# Patient Record
Sex: Female | Born: 1985 | Race: Black or African American | Hispanic: No | Marital: Single | Smoking: Current every day smoker
Health system: Southern US, Community
[De-identification: ages and names within clinical notes are randomized; demographics above are authoritative.]

## PROBLEM LIST (undated history)

## (undated) DIAGNOSIS — J45909 Unspecified asthma, uncomplicated: Secondary | ICD-10-CM

## (undated) DIAGNOSIS — G4730 Sleep apnea, unspecified: Secondary | ICD-10-CM

## (undated) HISTORY — DX: Sleep apnea, unspecified: G47.30

---

## 2009-04-20 ENCOUNTER — Observation Stay: Payer: Self-pay | Admitting: Obstetrics & Gynecology

## 2009-04-24 ENCOUNTER — Inpatient Hospital Stay: Payer: Self-pay | Admitting: Unknown Physician Specialty

## 2013-03-29 ENCOUNTER — Emergency Department (HOSPITAL_COMMUNITY)
Admission: EM | Admit: 2013-03-29 | Discharge: 2013-03-29 | Disposition: A | Discharge status: Home / Self Care | Payer: Medicaid Other | Attending: Emergency Medicine | Admitting: Emergency Medicine

## 2013-03-29 ENCOUNTER — Encounter (HOSPITAL_COMMUNITY): Payer: Self-pay | Admitting: Emergency Medicine

## 2013-03-29 DIAGNOSIS — J45909 Unspecified asthma, uncomplicated: Secondary | ICD-10-CM | POA: Insufficient documentation

## 2013-03-29 DIAGNOSIS — A084 Viral intestinal infection, unspecified: Secondary | ICD-10-CM

## 2013-03-29 DIAGNOSIS — A088 Other specified intestinal infections: Secondary | ICD-10-CM | POA: Insufficient documentation

## 2013-03-29 DIAGNOSIS — Z3202 Encounter for pregnancy test, result negative: Secondary | ICD-10-CM | POA: Insufficient documentation

## 2013-03-29 HISTORY — DX: Unspecified asthma, uncomplicated: J45.909

## 2013-03-29 LAB — URINE MICROSCOPIC-ADD ON

## 2013-03-29 LAB — CBC WITH DIFFERENTIAL/PLATELET
Basophils Relative: 0 % (ref 0–1)
Eosinophils Absolute: 0.1 10*3/uL (ref 0.0–0.7)
HCT: 44.3 % (ref 36.0–46.0)
Hemoglobin: 15.4 g/dL — ABNORMAL HIGH (ref 12.0–15.0)
MCHC: 34.8 g/dL (ref 30.0–36.0)
Monocytes Relative: 8 % (ref 3–12)
Neutro Abs: 5.8 10*3/uL (ref 1.7–7.7)
Neutrophils Relative %: 79 % — ABNORMAL HIGH (ref 43–77)
Platelets: 127 10*3/uL — ABNORMAL LOW (ref 150–400)
RBC: 5.16 MIL/uL — ABNORMAL HIGH (ref 3.87–5.11)

## 2013-03-29 LAB — COMPREHENSIVE METABOLIC PANEL
ALT: 17 U/L (ref 0–35)
AST: 18 U/L (ref 0–37)
Albumin: 4.8 g/dL (ref 3.5–5.2)
Alkaline Phosphatase: 91 U/L (ref 39–117)
BUN: 13 mg/dL (ref 6–23)
CO2: 20 mEq/L (ref 19–32)
Chloride: 104 mEq/L (ref 96–112)
Potassium: 4.2 mEq/L (ref 3.5–5.1)
Sodium: 139 mEq/L (ref 135–145)
Total Bilirubin: 0.5 mg/dL (ref 0.3–1.2)
Total Protein: 8.6 g/dL — ABNORMAL HIGH (ref 6.0–8.3)

## 2013-03-29 LAB — URINALYSIS, ROUTINE W REFLEX MICROSCOPIC
Glucose, UA: NEGATIVE mg/dL
Hgb urine dipstick: NEGATIVE
Ketones, ur: 15 mg/dL — AB
Nitrite: NEGATIVE
Specific Gravity, Urine: 1.031 — ABNORMAL HIGH (ref 1.005–1.030)
pH: 5.5 (ref 5.0–8.0)

## 2013-03-29 LAB — LIPASE, BLOOD: Lipase: 18 U/L (ref 11–59)

## 2013-03-29 MED ORDER — DIPHENOXYLATE-ATROPINE 2.5-0.025 MG PO TABS: 2.0000 | ORAL_TABLET | Freq: Four times a day (QID) | ORAL | Status: DC | PRN

## 2013-03-29 MED ORDER — SODIUM CHLORIDE 0.9 % IV BOLUS (SEPSIS)
1000.0000 mL | Freq: Once | INTRAVENOUS | Status: AC
  Administered 2013-03-29: 1000 mL via INTRAVENOUS

## 2013-03-29 MED ORDER — ONDANSETRON 8 MG PO TBDP: 8.0000 mg | ORAL_TABLET | Freq: Three times a day (TID) | ORAL | Status: DC | PRN

## 2013-03-29 MED ORDER — ONDANSETRON HCL 4 MG/2ML IJ SOLN
4.0000 mg | Freq: Once | INTRAMUSCULAR | Status: AC
  Administered 2013-03-29: 4 mg via INTRAVENOUS
  Filled 2013-03-29: qty 2

## 2013-03-29 MED ORDER — SODIUM CHLORIDE 0.9 % IV SOLN: INTRAVENOUS | Status: DC

## 2013-03-29 NOTE — ED Notes (Signed)
Pt c/o abd pain into back with N/V/D x 2 days

## 2013-03-29 NOTE — ED Notes (Signed)
Unable to obtain blood, lab called

## 2013-03-29 NOTE — ED Provider Notes (Signed)
CSN: [id number]     Arrival date & time 03/29/13  1833 History   First MD Initiated Contact with Patient 03/29/13 2148     Chief Complaint  Patient presents with  . Abdominal Pain  . Emesis  . Diarrhea   (Consider location/radiation/quality/duration/timing/severity/associated sxs/prior Treatment) Patient is a 27 y.o. female presenting with abdominal pain, vomiting, and diarrhea. The history is provided by the patient.  Abdominal Pain Associated symptoms: diarrhea and vomiting   Emesis Associated symptoms: abdominal pain and diarrhea   Diarrhea Associated symptoms: abdominal pain and vomiting    Patient here complaining of 24-hour history of nonbilious vomiting with associated watery diarrhea. Denies any fever or chills. Notes diffuse abdominal pain. No dysuria or hematuria. Symptoms have been persistent. She denies any sick exposures. Denies any headache or neck pain or photophobia. Nothing makes her symptoms better or worse. No treatment used prior to arrival Past Medical History  Diagnosis Date  . Asthma    History reviewed. No pertinent past surgical history. History reviewed. No pertinent family history. History  Substance Use Topics  . Smoking status: Never Smoker   . Smokeless tobacco: Not on file  . Alcohol Use: No   OB History   Grav Para Term Preterm Abortions TAB SAB Ect Mult Living                 Review of Systems  Gastrointestinal: Positive for vomiting, abdominal pain and diarrhea.  All other systems reviewed and are negative.    Allergies  Rocephin  Home Medications  No current outpatient prescriptions on file. BP 103/57  Pulse 81  Temp(Src) 98.1 F (36.7 C) (Oral)  Resp 18  SpO2 100% Physical Exam  Nursing note and vitals reviewed. Constitutional: She is oriented to person, place, and time. She appears well-developed and well-nourished.  Non-toxic appearance. No distress.  HENT:  Head: Normocephalic and atraumatic.  Eyes: Conjunctivae, EOM  and lids are normal. Pupils are equal, round, and reactive to light.  Neck: Normal range of motion. Neck supple. No tracheal deviation present. No mass present.  Cardiovascular: Normal rate, regular rhythm and normal heart sounds.  Exam reveals no gallop.   No murmur heard. Pulmonary/Chest: Effort normal and breath sounds normal. No stridor. No respiratory distress. She has no decreased breath sounds. She has no wheezes. She has no rhonchi. She has no rales.  Abdominal: Soft. Normal appearance and bowel sounds are normal. She exhibits no distension. There is no tenderness. There is no rebound and no CVA tenderness.  Musculoskeletal: Normal range of motion. She exhibits no edema and no tenderness.  Neurological: She is alert and oriented to person, place, and time. She has normal strength. No cranial nerve deficit or sensory deficit. GCS eye subscore is 4. GCS verbal subscore is 5. GCS motor subscore is 6.  Skin: Skin is warm and dry. No abrasion and no rash noted.  Psychiatric: She has a normal mood and affect. Her speech is normal and behavior is normal.    ED Course  Procedures (including critical care time) Labs Review Labs Reviewed  CBC WITH DIFFERENTIAL - Abnormal; Notable for the following:    RBC 5.16 (*)    Hemoglobin 15.4 (*)    Platelets 127 (*)    Neutrophils Relative % 79 (*)    All other components within normal limits  COMPREHENSIVE METABOLIC PANEL - Abnormal; Notable for the following:    Total Protein 8.6 (*)    GFR calc non Af Amer 87 (*)  All other components within normal limits  URINALYSIS, ROUTINE W REFLEX MICROSCOPIC - Abnormal; Notable for the following:    APPearance HAZY (*)    Specific Gravity, Urine 1.031 (*)    Bilirubin Urine SMALL (*)    Ketones, ur 15 (*)    Protein, ur 30 (*)    Leukocytes, UA SMALL (*)    All other components within normal limits  URINE MICROSCOPIC-ADD ON - Abnormal; Notable for the following:    Squamous Epithelial / LPF FEW (*)     Bacteria, UA FEW (*)    All other components within normal limits  URINE CULTURE  LIPASE, BLOOD  POCT PREGNANCY, URINE   Imaging Review No results found.  EKG Interpretation   None       MDM  No diagnosis found. Patient to be treated for likely gastroenteritis. Was given IV fluids here. Stable for discharge    Toy Baker, MD 03/29/13 [phone]

## 2013-03-31 LAB — URINE CULTURE: Colony Count: 70000

## 2013-06-19 ENCOUNTER — Emergency Department (HOSPITAL_COMMUNITY)
Admission: EM | Admit: 2013-06-19 | Discharge: 2013-06-19 | Disposition: A | Discharge status: Home / Self Care | Payer: Medicaid Other | Attending: Emergency Medicine | Admitting: Emergency Medicine

## 2013-06-19 ENCOUNTER — Encounter (HOSPITAL_COMMUNITY): Payer: Self-pay | Admitting: Emergency Medicine

## 2013-06-19 DIAGNOSIS — R091 Pleurisy: Secondary | ICD-10-CM | POA: Insufficient documentation

## 2013-06-19 DIAGNOSIS — A499 Bacterial infection, unspecified: Secondary | ICD-10-CM | POA: Insufficient documentation

## 2013-06-19 DIAGNOSIS — J45909 Unspecified asthma, uncomplicated: Secondary | ICD-10-CM | POA: Insufficient documentation

## 2013-06-19 DIAGNOSIS — N760 Acute vaginitis: Secondary | ICD-10-CM | POA: Insufficient documentation

## 2013-06-19 DIAGNOSIS — B9689 Other specified bacterial agents as the cause of diseases classified elsewhere: Secondary | ICD-10-CM | POA: Insufficient documentation

## 2013-06-19 DIAGNOSIS — Z3202 Encounter for pregnancy test, result negative: Secondary | ICD-10-CM | POA: Insufficient documentation

## 2013-06-19 LAB — CBC WITH DIFFERENTIAL/PLATELET
BASOS ABS: 0 10*3/uL (ref 0.0–0.1)
Basophils Relative: 0 % (ref 0–1)
Eosinophils Absolute: 0.1 10*3/uL (ref 0.0–0.7)
Eosinophils Relative: 2 % (ref 0–5)
HEMATOCRIT: 41.1 % (ref 36.0–46.0)
Hemoglobin: 14.2 g/dL (ref 12.0–15.0)
LYMPHS PCT: 49 % — AB (ref 12–46)
Lymphs Abs: 2.5 10*3/uL (ref 0.7–4.0)
MCH: 29.5 pg (ref 26.0–34.0)
MCHC: 34.5 g/dL (ref 30.0–36.0)
MCV: 85.4 fL (ref 78.0–100.0)
MONO ABS: 0.3 10*3/uL (ref 0.1–1.0)
Monocytes Relative: 6 % (ref 3–12)
Neutro Abs: 2.3 10*3/uL (ref 1.7–7.7)
Neutrophils Relative %: 44 % (ref 43–77)
PLATELETS: 134 10*3/uL — AB (ref 150–400)
RBC: 4.81 MIL/uL (ref 3.87–5.11)
RDW: 13.4 % (ref 11.5–15.5)
WBC: 5.2 10*3/uL (ref 4.0–10.5)

## 2013-06-19 LAB — URINALYSIS, ROUTINE W REFLEX MICROSCOPIC
Bilirubin Urine: NEGATIVE
Glucose, UA: NEGATIVE mg/dL
Hgb urine dipstick: NEGATIVE
Ketones, ur: NEGATIVE mg/dL
Leukocytes, UA: NEGATIVE
NITRITE: NEGATIVE
PROTEIN: NEGATIVE mg/dL
Specific Gravity, Urine: 1.022 (ref 1.005–1.030)
UROBILINOGEN UA: 1 mg/dL (ref 0.0–1.0)
pH: 6 (ref 5.0–8.0)

## 2013-06-19 LAB — COMPREHENSIVE METABOLIC PANEL
ALT: 17 U/L (ref 0–35)
AST: 17 U/L (ref 0–37)
Albumin: 4.2 g/dL (ref 3.5–5.2)
Alkaline Phosphatase: 101 U/L (ref 39–117)
BUN: 15 mg/dL (ref 6–23)
CO2: 24 mEq/L (ref 19–32)
Calcium: 9.6 mg/dL (ref 8.4–10.5)
Chloride: 103 mEq/L (ref 96–112)
Creatinine, Ser: 0.92 mg/dL (ref 0.50–1.10)
GFR calc non Af Amer: 86 mL/min — ABNORMAL LOW (ref >90)
Glucose, Bld: 69 mg/dL — ABNORMAL LOW (ref 70–99)
POTASSIUM: 3.7 meq/L (ref 3.7–5.3)
Sodium: 142 mEq/L (ref 137–147)
TOTAL PROTEIN: 7.7 g/dL (ref 6.0–8.3)
Total Bilirubin: 0.5 mg/dL (ref 0.3–1.2)

## 2013-06-19 LAB — WET PREP, GENITAL
Trich, Wet Prep: NONE SEEN
WBC, Wet Prep HPF POC: NONE SEEN
Yeast Wet Prep HPF POC: NONE SEEN

## 2013-06-19 LAB — I-STAT TROPONIN, ED: TROPONIN I, POC: 0 ng/mL (ref 0.00–0.08)

## 2013-06-19 LAB — LIPASE, BLOOD: Lipase: 16 U/L (ref 11–59)

## 2013-06-19 LAB — POC URINE PREG, ED: PREG TEST UR: NEGATIVE

## 2013-06-19 MED ORDER — METRONIDAZOLE 500 MG PO TABS: 500.0000 mg | ORAL_TABLET | Freq: Two times a day (BID) | ORAL | Status: DC

## 2013-06-19 MED ORDER — IBUPROFEN 600 MG PO TABS
600.0000 mg | ORAL_TABLET | Freq: Four times a day (QID) | ORAL | Status: DC | PRN
Start: 2013-06-19 — End: 2014-05-17

## 2013-06-19 NOTE — ED Notes (Signed)
Pt states she has been having sharp left sided cp at night and pain at her c-section site which was in 2011. Both areas have been hurting off and on for the past week. C/o some nausea but no vomiting or diarrhea. Denies any SOB.

## 2013-06-19 NOTE — Discharge Instructions (Signed)
We saw you in the ER for the chest pain and abdominal pain. All the results in the ER are normal, labs and imaging. We are not sure what is causing your symptoms. The workup in the ER is not complete, and is limited to screening for life threatening and emergent conditions only, so please see a primary care doctor for further evaluation.    Bacterial Vaginosis Bacterial vaginosis is a vaginal infection that occurs when the normal balance of bacteria in the vagina is disrupted. It results from an overgrowth of certain bacteria. This is the most common vaginal infection in women of childbearing age. Treatment is important to prevent complications, especially in pregnant women, as it can cause a premature delivery. CAUSES  Bacterial vaginosis is caused by an increase in harmful bacteria that are normally present in smaller amounts in the vagina. Several different kinds of bacteria can cause bacterial vaginosis. However, the reason that the condition develops is not fully understood. RISK FACTORS Certain activities or behaviors can put you at an increased risk of developing bacterial vaginosis, including:  Having a new sex partner or multiple sex partners.  Douching.  Using an intrauterine device (IUD) for contraception. Women do not get bacterial vaginosis from toilet seats, bedding, swimming pools, or contact with objects around them. SIGNS AND SYMPTOMS  Some women with bacterial vaginosis have no signs or symptoms. Common symptoms include:  Grey vaginal discharge.  A fishlike odor with discharge, especially after sexual intercourse.  Itching or burning of the vagina and vulva.  Burning or pain with urination. DIAGNOSIS  Your health care provider will take a medical history and examine the vagina for signs of bacterial vaginosis. A sample of vaginal fluid may be taken. Your health care provider will look at this sample under a microscope to check for bacteria and abnormal cells. A  vaginal pH test may also be done.  TREATMENT  Bacterial vaginosis may be treated with antibiotic medicines. These may be given in the form of a pill or a vaginal cream. A second round of antibiotics may be prescribed if the condition comes back after treatment.  HOME CARE INSTRUCTIONS   Only take over-the-counter or prescription medicines as directed by your health care provider.  If antibiotic medicine was prescribed, take it as directed. Make sure you finish it even if you start to feel better.  Do not have sex until treatment is completed.  Tell all sexual partners that you have a vaginal infection. They should see their health care provider and be treated if they have problems, such as a mild rash or itching.  Practice safe sex by using condoms and only having one sex partner. SEEK MEDICAL CARE IF:   Your symptoms are not improving after 3 days of treatment.  You have increased discharge or pain.  You have a fever. MAKE SURE YOU:   Understand these instructions.  Will watch your condition.  Will get help right away if you are not doing well or get worse. FOR MORE INFORMATION  Centers for Disease Control and Prevention, Division of STD Prevention: [employer]s.[employer].za American Sexual Health Association (ASHA): www.ashastd.org  Document Released: 03/30/2005 Document Revised: 01/18/2013 Document Reviewed: 11/09/2012 [facility] Patient Information 2014 [place], [place].  Pleurisy Pleurisy is an inflammation and swelling of the lining of the lungs (pleura). Because of this inflammation, it hurts to breathe. It can be aggravated by coughing, laughing, or deep breathing. Pleurisy is often caused by an underlying infection or disease.  HOME CARE INSTRUCTIONS  Monitor your pleurisy for any changes. The following actions may help to alleviate any discomfort you are experiencing:  Medicine may help with pain. Only take over-the-counter or prescription medicines for pain, discomfort, or fever  as directed by your health care provider.  Only take antibiotic medicine as directed. Make sure to finish it even if you start to feel better. SEEK MEDICAL CARE IF:   Your pain is not controlled with medicine or is increasing.  You have an increase in pus-like (purulent) secretions brought up with coughing. SEEK IMMEDIATE MEDICAL CARE IF:   You have blue or dark lips, fingernails, or toenails.  You are coughing up blood.  You have increased difficulty breathing.  You have continuing pain unrelieved by medicine or pain lasting more than 1 week.  You have pain that radiates into your neck, arms, or jaw.  You develop increased shortness of breath or wheezing.  You develop a fever, rash, vomiting, fainting, or other serious symptoms. MAKE SURE YOU:  Understand these instructions.   Will watch your condition.   Will get help right away if you are not doing well or get worse.  Document Released: 03/30/2005 Document Revised: 11/30/2012 Document Reviewed: 09/11/2012 [facility] Patient Information 2014 [place], [place].

## 2013-06-20 LAB — GC/CHLAMYDIA PROBE AMP
CT PROBE, AMP APTIMA: NEGATIVE
GC PROBE AMP APTIMA: NEGATIVE

## 2013-06-20 NOTE — ED Provider Notes (Signed)
CSN: [id number]     Arrival date & time 06/19/13  1557 History   First MD Initiated Contact with Patient 06/19/13 1803     Chief Complaint  Patient presents with  . Abdominal Pain  . Chest Pain     (Consider location/radiation/quality/duration/timing/severity/associated sxs/prior Treatment) HPI Comments: Pt comes in with cc of left sided chest pain, intermittent x 2 days and lower quadrant abd pain x 2 weeks. Both pains are intermittent, with no specific aggravating or relieving factors. Pt has no n/v/f/c/cough/dib. Chest pain is described as pressure like. No vaginal discharge, bleeding, uti like sx.  Patient is a 28 y.o. female presenting with abdominal pain and chest pain. The history is provided by the patient.  Abdominal Pain Associated symptoms: chest pain   Associated symptoms: no dysuria, no nausea, no shortness of breath and no vomiting   Chest Pain Associated symptoms: abdominal pain   Associated symptoms: no headache, no nausea, no shortness of breath and not vomiting     Past Medical History  Diagnosis Date  . Asthma    Past Surgical History  Procedure Laterality Date  . Cesarean section     No family history on file. History  Substance Use Topics  . Smoking status: Never Smoker   . Smokeless tobacco: Not on file  . Alcohol Use: No   OB History   Grav Para Term Preterm Abortions TAB SAB Ect Mult Living                 Review of Systems  Constitutional: Negative for activity change.  Respiratory: Negative for shortness of breath.   Cardiovascular: Positive for chest pain.  Gastrointestinal: Positive for abdominal pain. Negative for nausea and vomiting.  Genitourinary: Negative for dysuria.  Musculoskeletal: Negative for neck pain.  Neurological: Negative for headaches.  All other systems reviewed and are negative.      Allergies  Rocephin  Home Medications   Current Outpatient Rx  Name  Route  Sig  Dispense  Refill  . ibuprofen (ADVIL,MOTRIN)  600 MG tablet   Oral   Take 1 tablet (600 mg total) by mouth every 6 (six) hours as needed.   30 tablet   0   . metroNIDAZOLE (FLAGYL) 500 MG tablet   Oral   Take 1 tablet (500 mg total) by mouth 2 (two) times daily.   14 tablet   0    BP 113/71  Pulse 74  Temp(Src) 98.7 F (37.1 C) (Oral)  Resp 18  Wt 145 lb (65.772 kg)  SpO2 100% Physical Exam  Nursing note and vitals reviewed. Constitutional: She is oriented to person, place, and time. She appears well-developed.  HENT:  Head: Normocephalic and atraumatic.  Eyes: Conjunctivae and EOM are normal. Pupils are equal, round, and reactive to light.  Neck: Normal range of motion. Neck supple.  Cardiovascular: Normal rate, regular rhythm, normal heart sounds and intact distal pulses.   No murmur heard. Pulmonary/Chest: Effort normal. No respiratory distress. She has no wheezes.  Abdominal: Soft. Bowel sounds are normal. She exhibits no distension. There is tenderness. There is no rebound and no guarding.  Lower quadrant bilaterally, with no flank tenderness.  Genitourinary: Vagina normal and uterus normal.  External exam - normal, no lesions Speculum exam: Pt has some white discharge, no blood Bimanual exam: Patient has no CMT, no adnexal tenderness or fullness and cervical os is closed  Neurological: She is alert and oriented to person, place, and time.  Skin: Skin is  warm and dry.    ED Course  Procedures (including critical care time) Labs Review Labs Reviewed  WET PREP, GENITAL - Abnormal; Notable for the following:    Clue Cells Wet Prep HPF POC FEW (*)    All other components within normal limits  COMPREHENSIVE METABOLIC PANEL - Abnormal; Notable for the following:    Glucose, Bld 69 (*)    GFR calc non Af Amer 86 (*)    All other components within normal limits  CBC WITH DIFFERENTIAL - Abnormal; Notable for the following:    Platelets 134 (*)    Lymphocytes Relative 49 (*)    All other components within normal  limits  URINALYSIS, ROUTINE W REFLEX MICROSCOPIC - Abnormal; Notable for the following:    APPearance CLOUDY (*)    All other components within normal limits  GC/CHLAMYDIA PROBE AMP  LIPASE, BLOOD  I-STAT TROPOININ, ED  POC URINE PREG, ED   Imaging Review No results found.   EKG Interpretation   Date/Time:  Monday June 19 2013 16:12:23 EDT Ventricular Rate:  74 PR Interval:  134 QRS Duration: 88 QT Interval:  378 QTC Calculation: 419 R Axis:   62 Text Interpretation:  Normal sinus rhythm Normal ECG Confirmed by  Quintavious Rinck, MD, Janey Genta ([id number]) on 06/19/2013 6:36:41 PM      MDM   Final diagnoses:  Bacterial vaginosis  Pleurisy    PT comes in with intermittent chest pain and abd pain. Chest pain is pleuritic on occasion, no new cough, and she has WELLS score of 0 and is PERC neg. Pt's EKG is fine,. Labs are normal, no further concerns at this time from the ED perspective.  Abd pain - with non peritoneal abd exam and normal pelvic exam. Will treat the BV. Stable for discharge. PCP f/u requested, and return precautions discussed.    Derwood Kaplan, MD 06/20/13 [phone]

## 2013-07-17 ENCOUNTER — Emergency Department (INDEPENDENT_AMBULATORY_CARE_PROVIDER_SITE_OTHER)
Admission: EM | Admit: 2013-07-17 | Discharge: 2013-07-17 | Disposition: A | Discharge status: Home / Self Care | Payer: Medicaid Other | Source: Home / Self Care | Attending: Emergency Medicine | Admitting: Emergency Medicine

## 2013-07-17 ENCOUNTER — Encounter (HOSPITAL_COMMUNITY): Payer: Self-pay | Admitting: Emergency Medicine

## 2013-07-17 DIAGNOSIS — R1110 Vomiting, unspecified: Secondary | ICD-10-CM

## 2013-07-17 DIAGNOSIS — R109 Unspecified abdominal pain: Secondary | ICD-10-CM

## 2013-07-17 LAB — POCT URINALYSIS DIP (DEVICE)
Bilirubin Urine: NEGATIVE
Glucose, UA: NEGATIVE mg/dL
Hgb urine dipstick: NEGATIVE
Ketones, ur: NEGATIVE mg/dL
LEUKOCYTES UA: NEGATIVE
NITRITE: NEGATIVE
PROTEIN: NEGATIVE mg/dL
Specific Gravity, Urine: 1.015 (ref 1.005–1.030)
UROBILINOGEN UA: 0.2 mg/dL (ref 0.0–1.0)
pH: 6.5 (ref 5.0–8.0)

## 2013-07-17 LAB — POCT PREGNANCY, URINE: PREG TEST UR: NEGATIVE

## 2013-07-17 NOTE — ED Provider Notes (Signed)
Medical screening examination/treatment/procedure(s) were performed by non-physician practitioner and as supervising physician I was immediately available for consultation/collaboration.  Leslee Homeavid Hosey Burmester, M.D.  Reuben Likesavid C Berklie Dethlefs, MD 07/17/13 [phone]

## 2013-07-17 NOTE — ED Provider Notes (Signed)
CSN: [id number]     Arrival date & time 07/17/13  4098 History   First MD Initiated Contact with Patient 07/17/13 1020     Chief Complaint  Patient presents with  . Abdominal Pain   (Consider location/radiation/quality/duration/timing/severity/associated sxs/prior Treatment) HPI Comments: 28 year old female presents complaining of abdominal pain and vomiting for 2 days. She has had the abdominal pain across her lower abdomen for one month, and she just started to have the upper abdominal pain and vomiting after eating for the past 2 days. She has not been able to hold down any food for the past 2 days. The pain radiates through to her back. The pain is not worsened despite being. She denies fever, chills, diarrhea, constipation. Last bowel movement was yesterday and was normal.  Patient is a 28 y.o. female presenting with abdominal pain.  Abdominal Pain Associated symptoms: nausea and vomiting   Associated symptoms: no constipation and no diarrhea     Past Medical History  Diagnosis Date  . Asthma    Past Surgical History  Procedure Laterality Date  . Cesarean section     History reviewed. No pertinent family history. History  Substance Use Topics  . Smoking status: Never Smoker   . Smokeless tobacco: Not on file  . Alcohol Use: No   OB History   Grav Para Term Preterm Abortions TAB SAB Ect Mult Living                 Review of Systems  Gastrointestinal: Positive for nausea, vomiting and abdominal pain. Negative for diarrhea, constipation and blood in stool.  Musculoskeletal: Positive for back pain.  All other systems reviewed and are negative.    Allergies  Rocephin  Home Medications   Current Outpatient Rx  Name  Route  Sig  Dispense  Refill  . ibuprofen (ADVIL,MOTRIN) 600 MG tablet   Oral   Take 1 tablet (600 mg total) by mouth every 6 (six) hours as needed.   30 tablet   0   . metroNIDAZOLE (FLAGYL) 500 MG tablet   Oral   Take 1 tablet (500 mg total) by  mouth 2 (two) times daily.   14 tablet   0    BP 117/87  Pulse 72  Temp(Src) 98.2 F (36.8 C) (Oral)  Resp 16  SpO2 100% Physical Exam  Nursing note and vitals reviewed. Constitutional: She is oriented to person, place, and time. Vital signs are normal. She appears well-developed and well-nourished. No distress.  HENT:  Head: Normocephalic and atraumatic.  Cardiovascular: Normal rate and regular rhythm.  Exam reveals no gallop and no friction rub.   No murmur heard. Pulmonary/Chest: Effort normal and breath sounds normal. No respiratory distress. She has no wheezes. She has no rales.  Abdominal: Normal appearance and bowel sounds are normal. She exhibits no mass. There is tenderness in the right upper quadrant, right lower quadrant, epigastric area, suprapubic area and left lower quadrant. There is positive Murphy's sign. There is no rigidity, no rebound, no guarding and no tenderness at McBurney's point.  Increased right upper quadrant and epigastric abdominal pain with coughing  Neurological: She is alert and oriented to person, place, and time. She has normal strength. Coordination normal.  Skin: Skin is warm and dry. No rash noted. She is not diaphoretic.  Psychiatric: She has a normal mood and affect. Judgment normal.    ED Course  Procedures (including critical care time) Labs Review Labs Reviewed  POCT URINALYSIS DIP (DEVICE)  POCT  PREGNANCY, URINE   Imaging Review No results found.   MDM   1. Abdominal pain   2. Vomiting    I explained to patient that we would need to send some labs and do a pelvic exam to further investigate cause of her abdominal pain. She says that she cannot do that and she is to go to work. I have explained to her the risks and possible seriousness of her condition, she says she is okay with that and she will come back. She is leaving AMA, AMA form signed    Graylon GoodZachary H Tanor Glaspy, PA-C 07/17/13 1037

## 2013-07-17 NOTE — ED Notes (Signed)
C/o  Abdominal pain with vomiting x 2 days.  Lower back pain with urinary frequency.  No relief of pain with ibuprofen.   Denies any other symptoms.

## 2014-04-02 DIAGNOSIS — J45909 Unspecified asthma, uncomplicated: Secondary | ICD-10-CM | POA: Diagnosis not present

## 2014-04-02 DIAGNOSIS — N730 Acute parametritis and pelvic cellulitis: Secondary | ICD-10-CM | POA: Insufficient documentation

## 2014-04-02 DIAGNOSIS — Z3202 Encounter for pregnancy test, result negative: Secondary | ICD-10-CM | POA: Diagnosis not present

## 2014-04-02 DIAGNOSIS — R109 Unspecified abdominal pain: Secondary | ICD-10-CM | POA: Diagnosis present

## 2014-04-02 DIAGNOSIS — A5901 Trichomonal vulvovaginitis: Secondary | ICD-10-CM | POA: Insufficient documentation

## 2014-04-02 DIAGNOSIS — Z792 Long term (current) use of antibiotics: Secondary | ICD-10-CM | POA: Diagnosis not present

## 2014-04-03 ENCOUNTER — Encounter (HOSPITAL_COMMUNITY): Payer: Self-pay | Admitting: *Deleted

## 2014-04-03 ENCOUNTER — Emergency Department (HOSPITAL_COMMUNITY)
Admission: EM | Admit: 2014-04-03 | Discharge: 2014-04-03 | Disposition: A | Discharge status: Home / Self Care | Payer: Medicaid Other | Attending: Emergency Medicine | Admitting: Emergency Medicine

## 2014-04-03 ENCOUNTER — Emergency Department (HOSPITAL_COMMUNITY): Payer: Medicaid Other

## 2014-04-03 DIAGNOSIS — N760 Acute vaginitis: Secondary | ICD-10-CM

## 2014-04-03 DIAGNOSIS — R102 Pelvic and perineal pain: Secondary | ICD-10-CM

## 2014-04-03 DIAGNOSIS — A5901 Trichomonal vulvovaginitis: Secondary | ICD-10-CM

## 2014-04-03 DIAGNOSIS — N730 Acute parametritis and pelvic cellulitis: Secondary | ICD-10-CM

## 2014-04-03 DIAGNOSIS — B9689 Other specified bacterial agents as the cause of diseases classified elsewhere: Secondary | ICD-10-CM

## 2014-04-03 LAB — CBC WITH DIFFERENTIAL/PLATELET
BASOS ABS: 0 10*3/uL (ref 0.0–0.1)
BASOS PCT: 0 % (ref 0–1)
EOS ABS: 0.2 10*3/uL (ref 0.0–0.7)
Eosinophils Relative: 3 % (ref 0–5)
HCT: 39 % (ref 36.0–46.0)
Hemoglobin: 13 g/dL (ref 12.0–15.0)
Lymphocytes Relative: 45 % (ref 12–46)
Lymphs Abs: 2.6 10*3/uL (ref 0.7–4.0)
MCH: 28.8 pg (ref 26.0–34.0)
MCHC: 33.3 g/dL (ref 30.0–36.0)
MCV: 86.3 fL (ref 78.0–100.0)
Monocytes Absolute: 0.5 10*3/uL (ref 0.1–1.0)
Monocytes Relative: 9 % (ref 3–12)
NEUTROS PCT: 43 % (ref 43–77)
Neutro Abs: 2.5 10*3/uL (ref 1.7–7.7)
Platelets: 119 10*3/uL — ABNORMAL LOW (ref 150–400)
RBC: 4.52 MIL/uL (ref 3.87–5.11)
RDW: 13.6 % (ref 11.5–15.5)
WBC: 5.8 10*3/uL (ref 4.0–10.5)

## 2014-04-03 LAB — URINALYSIS, ROUTINE W REFLEX MICROSCOPIC
Bilirubin Urine: NEGATIVE
Glucose, UA: NEGATIVE mg/dL
Ketones, ur: NEGATIVE mg/dL
Nitrite: NEGATIVE
PROTEIN: NEGATIVE mg/dL
SPECIFIC GRAVITY, URINE: 1.028 (ref 1.005–1.030)
UROBILINOGEN UA: 0.2 mg/dL (ref 0.0–1.0)
pH: 6 (ref 5.0–8.0)

## 2014-04-03 LAB — COMPREHENSIVE METABOLIC PANEL
ALBUMIN: 3.9 g/dL (ref 3.5–5.2)
ALT: 31 U/L (ref 0–35)
AST: 23 U/L (ref 0–37)
Alkaline Phosphatase: 88 U/L (ref 39–117)
Anion gap: 8 (ref 5–15)
BILIRUBIN TOTAL: 0.4 mg/dL (ref 0.3–1.2)
BUN: 17 mg/dL (ref 6–23)
CO2: 20 mmol/L (ref 19–32)
CREATININE: 0.9 mg/dL (ref 0.50–1.10)
Calcium: 9.1 mg/dL (ref 8.4–10.5)
Chloride: 111 mEq/L (ref 96–112)
GFR calc Af Amer: >90 mL/min (ref >90)
GFR calc non Af Amer: 87 mL/min — ABNORMAL LOW (ref >90)
Glucose, Bld: 97 mg/dL (ref 70–99)
POTASSIUM: 4.1 mmol/L (ref 3.5–5.1)
Sodium: 139 mmol/L (ref 135–145)
TOTAL PROTEIN: 6.9 g/dL (ref 6.0–8.3)

## 2014-04-03 LAB — WET PREP, GENITAL: YEAST WET PREP: NONE SEEN

## 2014-04-03 LAB — URINE MICROSCOPIC-ADD ON

## 2014-04-03 LAB — HIV ANTIBODY (ROUTINE TESTING W REFLEX): HIV 1&2 Ab, 4th Generation: NONREACTIVE

## 2014-04-03 LAB — PREGNANCY, URINE: Preg Test, Ur: NEGATIVE

## 2014-04-03 LAB — RPR

## 2014-04-03 LAB — LIPASE, BLOOD: LIPASE: 24 U/L (ref 11–59)

## 2014-04-03 MED ORDER — METRONIDAZOLE 500 MG PO TABS
500.0000 mg | ORAL_TABLET | Freq: Once | ORAL | Status: AC
  Administered 2014-04-03: 500 mg via ORAL
  Filled 2014-04-03: qty 1

## 2014-04-03 MED ORDER — LEVOFLOXACIN 500 MG PO TABS: 500.0000 mg | ORAL_TABLET | Freq: Every day | ORAL | Status: DC

## 2014-04-03 MED ORDER — AZITHROMYCIN 1 G PO PACK
1.0000 g | PACK | Freq: Once | ORAL | Status: AC
  Administered 2014-04-03: 1 g via ORAL
  Filled 2014-04-03: qty 1

## 2014-04-03 MED ORDER — METRONIDAZOLE 500 MG PO TABS: 500.0000 mg | ORAL_TABLET | Freq: Two times a day (BID) | ORAL | Status: DC

## 2014-04-03 MED ORDER — ONDANSETRON HCL 4 MG/2ML IJ SOLN
4.0000 mg | Freq: Once | INTRAMUSCULAR | Status: AC
  Administered 2014-04-03: 4 mg via INTRAVENOUS
  Filled 2014-04-03: qty 2

## 2014-04-03 MED ORDER — HYDROMORPHONE HCL 1 MG/ML IJ SOLN
1.0000 mg | Freq: Once | INTRAMUSCULAR | Status: AC
  Administered 2014-04-03: 1 mg via INTRAVENOUS
  Filled 2014-04-03: qty 1

## 2014-04-03 MED ORDER — LEVOFLOXACIN 500 MG PO TABS
500.0000 mg | ORAL_TABLET | Freq: Once | ORAL | Status: AC
  Administered 2014-04-03: 500 mg via ORAL
  Filled 2014-04-03: qty 1

## 2014-04-03 MED ORDER — OXYCODONE-ACETAMINOPHEN 5-325 MG PO TABS: 1.0000 | ORAL_TABLET | ORAL | Status: DC | PRN

## 2014-04-03 NOTE — ED Notes (Signed)
MD at bedside. 

## 2014-04-03 NOTE — ED Provider Notes (Signed)
erCSN: [id number]     Arrival date & time 04/02/14  2359 History   First MD Initiated Contact with Patient 04/03/14 0007    This chart was scribed for Lauren Booze, MD by Marica Otter, ED Scribe. This patient was seen in room A03C/A03C and the patient's care was started at 12:20 AM.  Chief Complaint  Patient presents with  . Abdominal Pain   The history is provided by the patient. No language interpreter was used.   PCP: No PCP Per Patient HPI Comments: Lauren Ferguson is a 28 y.o. female, with medical Hx noted below, who presents to the Emergency Department complaining of acute, atraumatic, intermittent, unimproved, lower abd pain onset 5 months ago with associated nipple soreness. Pt reports walking or going over bumps on the road worsens the pain; and laying down alleviates the pain. Pt describes her pain as a cramping sensation and rates it an 8 out of 10. Pt notes, at its worst, her abd pain was a 10 out of 10. Pt also complains of associated nausea and pain with intercourse.   Pt reports seeking medical attention last month for her abd pain when she was referred to OBGYN. Pt states the OBGYN did an ultrasound and found fluid in her uterus; and consequently, started pt on antibiotics due to an infection. Pt notes that she was noncompliant with her antibiotics. Pt denies taking any measures at home, including taking any OTC meds, to alleviate her abd pain.   Pt also complains of vaginal bleeding, noting that she normally on the depo shot for [facility], however, the last time she received a depo shot was three months ago. Pt states that she does not get periods due to the depo shot, thus, her present vaginal bleeding is not normal. Pt denies taking any measures at this time for [facility]   Pt denies vomiting, fever, chills, vaginal discharge.    Past Medical History  Diagnosis Date  . Asthma    Past Surgical History  Procedure Laterality Date  . Cesarean section     No family history on file. History   Substance Use Topics  . Smoking status: Never Smoker   . Smokeless tobacco: Not on file  . Alcohol Use: No   OB History    No data available     Review of Systems  Constitutional: Negative for fever and chills.  Gastrointestinal: Positive for nausea and abdominal pain (lower abd pain). Negative for vomiting.  Genitourinary: Positive for vaginal bleeding. Negative for vaginal discharge.  Psychiatric/Behavioral: Negative for confusion.  All other systems reviewed and are negative.     Allergies  Rocephin  Home Medications   Prior to Admission medications   Medication Sig Start Date End Date Taking? Authorizing Provider  ibuprofen (ADVIL,MOTRIN) 600 MG tablet Take 1 tablet (600 mg total) by mouth every 6 (six) hours as needed. 06/19/13   Derwood Kaplan, MD  metroNIDAZOLE (FLAGYL) 500 MG tablet Take 1 tablet (500 mg total) by mouth 2 (two) times daily. 06/19/13   Derwood Kaplan, MD   Triage Vitals: BP 122/66 mmHg  Pulse 93  Temp(Src) 98.6 F (37 C) (Oral)  Resp 20  SpO2 98% Physical Exam  Constitutional: She is oriented to person, place, and time. She appears well-developed and well-nourished. No distress.  HENT:  Head: Normocephalic and atraumatic.  Eyes: Conjunctivae and EOM are normal. Pupils are equal, round, and reactive to light.  Neck: Normal range of motion. Neck supple. No JVD present.  Cardiovascular: Normal rate,  regular rhythm and normal heart sounds.   No murmur heard. Pulmonary/Chest: Effort normal and breath sounds normal. She has no wheezes. She has no rales.  Abdominal: Soft. Bowel sounds are normal. She exhibits no distension and no mass. There is tenderness (moderate tenderness diffusely through suprapubic region ). There is no rebound and no guarding.  Genitourinary:  Normal external female genitalia. Marked tenderness on insertion of speculum. Cervix has mucopurulent discharge without bleeding. Bimanual exam shows diffuse tenderness and cervical motion  tenderness without any discrete masses. Fundus is retroverted and difficult to assess size.  Musculoskeletal: Normal range of motion. She exhibits no edema.  Lymphadenopathy:    She has no cervical adenopathy.  Neurological: She is alert and oriented to person, place, and time. She has normal reflexes. No cranial nerve deficit. Coordination normal.  Skin: Skin is warm and dry. No rash noted.  Psychiatric: She has a normal mood and affect. Her behavior is normal. Judgment and thought content normal.  Nursing note and vitals reviewed.   ED Course  Procedures (including critical care time) DIAGNOSTIC STUDIES: Oxygen Saturation is 98% on RA, nl by my interpretation.    COORDINATION OF CARE: 12:31 AM-Discussed treatment plan which includes pelvic exam, labs, ultrasound, meds with pt at bedside and pt agreed to plan.   Labs Review Results for orders placed or performed during the hospital encounter of 04/03/14  Wet prep, genital  Result Value Ref Range   Yeast Wet Prep HPF POC NONE SEEN NONE SEEN   Trich, Wet Prep MODERATE (A) NONE SEEN   Clue Cells Wet Prep HPF POC MANY (A) NONE SEEN   WBC, Wet Prep HPF POC MANY (A) NONE SEEN  CBC with Differential  Result Value Ref Range   WBC 5.8 4.0 - 10.5 K/uL   RBC 4.52 3.87 - 5.11 MIL/uL   Hemoglobin 13.0 12.0 - 15.0 g/dL   HCT 98.139.0 19.136.0 - 47.846.0 %   MCV 86.3 78.0 - 100.0 fL   MCH 28.8 26.0 - 34.0 pg   MCHC 33.3 30.0 - 36.0 g/dL   RDW 29.513.6 62.111.5 - 30.815.5 %   Platelets 119 (L) 150 - 400 K/uL   Neutrophils Relative % 43 43 - 77 %   Neutro Abs 2.5 1.7 - 7.7 K/uL   Lymphocytes Relative 45 12 - 46 %   Lymphs Abs 2.6 0.7 - 4.0 K/uL   Monocytes Relative 9 3 - 12 %   Monocytes Absolute 0.5 0.1 - 1.0 K/uL   Eosinophils Relative 3 0 - 5 %   Eosinophils Absolute 0.2 0.0 - 0.7 K/uL   Basophils Relative 0 0 - 1 %   Basophils Absolute 0.0 0.0 - 0.1 K/uL  Comprehensive metabolic panel  Result Value Ref Range   Sodium 139 135 - 145 mmol/L   Potassium  4.1 3.5 - 5.1 mmol/L   Chloride 111 96 - 112 mEq/L   CO2 20 19 - 32 mmol/L   Glucose, Bld 97 70 - 99 mg/dL   BUN 17 6 - 23 mg/dL   Creatinine, Ser 6.570.90 0.50 - 1.10 mg/dL   Calcium 9.1 8.4 - 84.610.5 mg/dL   Total Protein 6.9 6.0 - 8.3 g/dL   Albumin 3.9 3.5 - 5.2 g/dL   AST 23 0 - 37 U/L   ALT 31 0 - 35 U/L   Alkaline Phosphatase 88 39 - 117 U/L   Total Bilirubin 0.4 0.3 - 1.2 mg/dL   GFR calc non Af Amer 87 (L) >90  mL/min   GFR calc Af Amer >90 >90 mL/min   Anion gap 8 5 - 15  Lipase, blood  Result Value Ref Range   Lipase 24 11 - 59 U/L  Pregnancy, urine  Result Value Ref Range   Preg Test, Ur NEGATIVE NEGATIVE  Urinalysis, Routine w reflex microscopic  Result Value Ref Range   Color, Urine YELLOW YELLOW   APPearance CLOUDY (A) CLEAR   Specific Gravity, Urine 1.028 1.005 - 1.030   pH 6.0 5.0 - 8.0   Glucose, UA NEGATIVE NEGATIVE mg/dL   Hgb urine dipstick MODERATE (A) NEGATIVE   Bilirubin Urine NEGATIVE NEGATIVE   Ketones, ur NEGATIVE NEGATIVE mg/dL   Protein, ur NEGATIVE NEGATIVE mg/dL   Urobilinogen, UA 0.2 0.0 - 1.0 mg/dL   Nitrite NEGATIVE NEGATIVE   Leukocytes, UA SMALL (A) NEGATIVE  Urine microscopic-add on  Result Value Ref Range   Squamous Epithelial / LPF MANY (A) RARE   WBC, UA 7-10 <3 WBC/hpf   RBC / HPF 3-6 <3 RBC/hpf   Bacteria, UA MANY (A) RARE   Urine-Other TRICHOMONAS PRESENT    Imaging Review US Transvaginal Non-ob  04/03/2014   CLINICAL DATA:  Pelvic pain, spotting  EXAM: TRANSABDOMINAL AND TRANSVAGINAL ULTRASOUND OF PELVIS  TECHNIQUE: Both transabdominal and transvaginal ultrasound examinations of the pelvis were performed. Transabdominal technique was performed for global imaging of the pelvis including uterus, ovaries, adnexal regions, and pelvic cul-de-sac. It was necessary to proceed with endovaginal exam following the transabdominal exam to visualize the endometrium and ovaries.  COMPARISON:  None  FINDINGS: Uterus  Measurements: 8.2 x 4.5 x 3.3  cm, anteverted, anteflexed. No fibroids or other mass visualized.  Endometrium  Thickness: 3 mm, uniformly thin.  No focal abnormality visualized.  Right ovary  Measurements: 2.3 x 1.9 x 1.6 cm. Normal appearance/no adnexal mass.  Left ovary  Measurements: Not visualized. No adnexal mass.  Other findings  No free fluid.  IMPRESSION: Normal exam.  Left ovary not visualized, no left adnexal mass.   Electronically Signed   By: Christiana Pellant M.D.   On: 04/03/2014 02:44   US Pelvis Complete  04/03/2014   CLINICAL DATA:  Pelvic pain, spotting  EXAM: TRANSABDOMINAL AND TRANSVAGINAL ULTRASOUND OF PELVIS  TECHNIQUE: Both transabdominal and transvaginal ultrasound examinations of the pelvis were performed. Transabdominal technique was performed for global imaging of the pelvis including uterus, ovaries, adnexal regions, and pelvic cul-de-sac. It was necessary to proceed with endovaginal exam following the transabdominal exam to visualize the endometrium and ovaries.  COMPARISON:  None  FINDINGS: Uterus  Measurements: 8.2 x 4.5 x 3.3 cm, anteverted, anteflexed. No fibroids or other mass visualized.  Endometrium  Thickness: 3 mm, uniformly thin.  No focal abnormality visualized.  Right ovary  Measurements: 2.3 x 1.9 x 1.6 cm. Normal appearance/no adnexal mass.  Left ovary  Measurements: Not visualized. No adnexal mass.  Other findings  No free fluid.  IMPRESSION: Normal exam.  Left ovary not visualized, no left adnexal mass.   Electronically Signed   By: Christiana Pellant M.D.   On: 04/03/2014 02:44   MDM   Final diagnoses:  Pelvic pain in female  PID (acute pelvic inflammatory disease)  Trichomonal vaginitis  BV (bacterial vaginosis)    Pelvic pain most compatible with pelvic inflammatory disease. Review of old records shows she had been diagnosed with Trichomonas infection in the past. Symptoms have persisted because she failed to follow-through with a course of antibiotics. Pelvic ultrasound is obtained which  shows no acute process. Laboratory workup was otherwise unremarkable with exception of clue cells consistent with bacterial vaginosis. Antibiotic selection is difficult given allergy to ceftriaxone. She is given a dose of levofloxacin, metronidazole, and azithromycin and is discharged with prescriptions for levofloxacin and metronidazole to be taken for 2 weeks. Prescription is given for oxycodone and acetaminophen for pain. [facility] follow-up. Importance of completing a full course of antibiotics was stressed to the patient. Risk of chronic pain and infertility was explained.  I personally performed the services described in this documentation, which was scribed in my presence. The recorded information has been reviewed and is accurate.     Lauren Boozeavid Mahlon Gabrielle, MD 04/03/14 [phone]

## 2014-04-03 NOTE — ED Notes (Signed)
The pt has  Had lower abd pain for 2 months and she has had nipple soreness.  lmp  Unknown  Missed her depo shot this month  niw she has vagiinal bleeding

## 2014-04-03 NOTE — Discharge Instructions (Signed)
Pelvic Inflammatory Disease °Pelvic inflammatory disease (PID) refers to an infection in some or all of the female organs. The infection can be in the uterus, ovaries, fallopian tubes, or the surrounding tissues in the pelvis. PID can cause abdominal or pelvic pain that comes on suddenly (acute pelvic pain). PID is a serious infection because it can lead to lasting (chronic) pelvic pain or the inability to have children (infertile).  °CAUSES  °The infection is often caused by the normal bacteria found in the vaginal tissues. PID may also be caused by an infection that is spread during sexual contact. PID can also occur following:  °· The birth of a baby.   °· A miscarriage.   °· An abortion.   °· Major pelvic surgery.   °· The use of an intrauterine device (IUD).   °· A sexual assault.   °RISK FACTORS °Certain factors can put a person at higher risk for PID, such as: °· Being younger than 25 years. °· Being sexually active at a young age. °· Using nonbarrier contraception. °· Having multiple sexual partners. °· Having sex with someone who has symptoms of a genital infection. °· Using oral contraception. °Other times, certain behaviors can increase the possibility of getting PID, such as: °· Having sex during your period. °· Using a vaginal douche. °· Having an intrauterine device (IUD) in place. °SYMPTOMS  °· Abdominal or pelvic pain.   °· Fever.   °· Chills.   °· Abnormal vaginal discharge. °· Abnormal uterine bleeding.   °· Unusual pain shortly after finishing your period. °DIAGNOSIS  °Your caregiver will choose some of the following methods to make a diagnosis, such as:  °· Performing a physical exam and history. A pelvic exam typically reveals a very tender uterus and surrounding pelvis.   °· Ordering laboratory tests including a pregnancy test, blood tests, and urine test.  °· Ordering cultures of the vagina and cervix to check for a sexually transmitted infection (STI). °· Performing an ultrasound.    °· Performing a laparoscopic procedure to look inside the pelvis.   °TREATMENT  °· Antibiotic medicines may be prescribed and taken by mouth.   °· Sexual partners may be treated when the infection is caused by a sexually transmitted disease (STD).   °· Hospitalization may be needed to give antibiotics intravenously. °· Surgery may be needed, but this is rare. °It may take weeks until you are completely well. If you are diagnosed with PID, you should also be checked for human immunodeficiency virus (HIV).   °HOME CARE INSTRUCTIONS  °· If given, take your antibiotics as directed. Finish the medicine even if you start to feel better.   °· Only take over-the-counter or prescription medicines for pain, discomfort, or fever as directed by your caregiver.   °· Do not have sexual intercourse until treatment is completed or as directed by your caregiver. If PID is confirmed, your recent sexual partner(s) will need treatment.   °· Keep your follow-up appointments. °SEEK MEDICAL CARE IF:  °· You have increased or abnormal vaginal discharge.   °· You need prescription medicine for your pain.   °· You vomit.   °· You cannot take your medicines.   °· Your partner has an STD.   °SEEK IMMEDIATE MEDICAL CARE IF:  °· You have a fever.   °· You have increased abdominal or pelvic pain.   °· You have chills.   °· You have pain when you urinate.   °· You are not better after 72 hours following treatment.   °MAKE SURE YOU:  °· Understand these instructions. °· Will watch your condition. °· Will get help right away if you are not doing well or get worse. °  Document Released: 03/30/2005 Document Revised: 07/25/2012 Document Reviewed: 03/26/2011 [facility] Patient Information 2015 [place], [place]. This information is not intended to replace advice given to you by your health care provider. Make sure you discuss any questions you have with your health care provider.  Trichomoniasis Trichomoniasis is an infection caused by an organism  called Trichomonas. The infection can affect both women and men. In women, the outer female genitalia and the vagina are affected. In men, the penis is mainly affected, but the prostate and other reproductive organs can also be involved. Trichomoniasis is a sexually transmitted infection (STI) and is most often passed to another person through sexual contact.  RISK FACTORS  Having unprotected sexual intercourse.  Having sexual intercourse with an infected partner. SIGNS AND SYMPTOMS  Symptoms of trichomoniasis in women include:  Abnormal gray-green frothy vaginal discharge.  Itching and irritation of the vagina.  Itching and irritation of the area outside the vagina. Symptoms of trichomoniasis in men include:   Penile discharge with or without pain.  Pain during urination. This results from inflammation of the urethra. DIAGNOSIS  Trichomoniasis may be found during a Pap test or physical exam. Your health care provider may use one of the following methods to help diagnose this infection:  Examining vaginal discharge under a microscope. For men, urethral discharge would be examined.  Testing the pH of the vagina with a test tape.  Using a vaginal swab test that checks for the Trichomonas organism. A test is available that provides results within a few minutes.  Doing a culture test for the organism. This is not usually needed. TREATMENT   You may be given medicine to fight the infection. Women should inform their health care provider if they could be or are pregnant. Some medicines used to treat the infection should not be taken during pregnancy.  Your health care provider may recommend over-the-counter medicines or creams to decrease itching or irritation.  Your sexual partner will need to be treated if infected. HOME CARE INSTRUCTIONS   Take medicines only as directed by your health care provider.  Take over-the-counter medicine for itching or irritation as directed by your  health care provider.  Do not have sexual intercourse while you have the infection.  Women should not douche or wear tampons while they have the infection.  Discuss your infection with your partner. Your partner may have gotten the infection from you, or you may have gotten it from your partner.  Have your sex partner get examined and treated if necessary.  Practice safe, informed, and protected sex.  See your health care provider for other STI testing. SEEK MEDICAL CARE IF:   You still have symptoms after you finish your medicine.  You develop abdominal pain.  You have pain when you urinate.  You have bleeding after sexual intercourse.  You develop a rash.  Your medicine makes you sick or makes you throw up (vomit). MAKE SURE YOU:  Understand these instructions.  Will watch your condition.  Will get help right away if you are not doing well or get worse. Document Released: 09/23/2000 Document Revised: 08/14/2013 Document Reviewed: 01/09/2013 [facility] Patient Information 2015 [place], [place]. This information is not intended to replace advice given to you by your health care provider. Make sure you discuss any questions you have with your health care provider.  Bacterial Vaginosis Bacterial vaginosis is a vaginal infection that occurs when the normal balance of bacteria in the vagina is disrupted. It results from an overgrowth of  certain bacteria. This is the most common vaginal infection in women of childbearing age. Treatment is important to prevent complications, especially in pregnant women, as it can cause a premature delivery. CAUSES  Bacterial vaginosis is caused by an increase in harmful bacteria that are normally present in smaller amounts in the vagina. Several different kinds of bacteria can cause bacterial vaginosis. However, the reason that the condition develops is not fully understood. RISK FACTORS Certain activities or behaviors can put you at an increased  risk of developing bacterial vaginosis, including:  Having a new sex partner or multiple sex partners.  Douching.  Using an intrauterine device (IUD) for contraception. Women do not get bacterial vaginosis from toilet seats, bedding, swimming pools, or contact with objects around them. SIGNS AND SYMPTOMS  Some women with bacterial vaginosis have no signs or symptoms. Common symptoms include:  Grey vaginal discharge.  A fishlike odor with discharge, especially after sexual intercourse.  Itching or burning of the vagina and vulva.  Burning or pain with urination. DIAGNOSIS  Your health care provider will take a medical history and examine the vagina for signs of bacterial vaginosis. A sample of vaginal fluid may be taken. Your health care provider will look at this sample under a microscope to check for bacteria and abnormal cells. A vaginal pH test may also be done.  TREATMENT  Bacterial vaginosis may be treated with antibiotic medicines. These may be given in the form of a pill or a vaginal cream. A second round of antibiotics may be prescribed if the condition comes back after treatment.  HOME CARE INSTRUCTIONS   Only take over-the-counter or prescription medicines as directed by your health care provider.  If antibiotic medicine was prescribed, take it as directed. Make sure you finish it even if you start to feel better.  Do not have sex until treatment is completed.  Tell all sexual partners that you have a vaginal infection. They should see their health care provider and be treated if they have problems, such as a mild rash or itching.  Practice safe sex by using condoms and only having one sex partner. SEEK MEDICAL CARE IF:   Your symptoms are not improving after 3 days of treatment.  You have increased discharge or pain.  You have a fever. MAKE SURE YOU:   Understand these instructions.  Will watch your condition.  Will get help right away if you are not doing  well or get worse. FOR MORE INFORMATION  Centers for Disease Control and Prevention, Division of STD Prevention: SolutionApps.co.zawww.cdc.gov/std American Sexual Health Association (ASHA): www.ashastd.org  Document Released: 03/30/2005 Document Revised: 01/18/2013 Document Reviewed: 11/09/2012 [facility] Patient Information 2015 [place], [place]. This information is not intended to replace advice given to you by your health care provider. Make sure you discuss any questions you have with your health care provider.  Levofloxacin tablets What is this medicine? LEVOFLOXACIN (lee voe FLOX a sin) is a quinolone antibiotic. It is used to treat certain kinds of bacterial infections. It will not work for colds, flu, or other viral infections. This medicine may be used for other purposes; ask your health care provider or pharmacist if you have questions. COMMON BRAND NAME(S): Levaquin, Levaquin Leva-Pak What should I tell my health care provider before I take this medicine? They need to know if you have any of these conditions: -cerebral disease -irregular heartbeat -kidney disease -seizure disorder -an unusual or allergic reaction to levofloxacin, other antibiotics or medicines, foods, dyes, or preservatives -pregnant  or trying to get pregnant -breast-feeding How should I use this medicine? Take this medicine by mouth with a full glass of water. Follow the directions on the prescription label. This medicine can be taken with or without food. Take your medicine at regular intervals. Do not take your medicine more often than directed. Do not skip doses or stop your medicine early even if you feel better. Do not stop taking except on your doctor's advice. A special MedGuide will be given to you by the pharmacist with each prescription and refill. Be sure to read this information carefully each time. Talk to your pediatrician regarding the use of this medicine in children. While this drug may be prescribed for children as  young as 6 months for selected conditions, precautions do apply. Overdosage: If you think you have taken too much of this medicine contact a poison control center or emergency room at once. NOTE: This medicine is only for you. Do not share this medicine with others. What if I miss a dose? If you miss a dose, take it as soon as you remember. If it is almost time for your next dose, take only that dose. Do not take double or extra doses. What may interact with this medicine? Do not take this medicine with any of the following medications: -arsenic trioxide -chloroquine -droperidol -medicines for irregular heart rhythm like amiodarone, disopyramide, dofetilide, flecainide, quinidine, procainamide, sotalol -some medicines for depression or mental problems like phenothiazines, pimozide, and ziprasidone This medicine may also interact with the following medications: -amoxapine -antacids -birth control pills -cisapride -dairy products -didanosine (ddI) buffered tablets or powder -haloperidol -multivitamins -NSAIDS, medicines for pain and inflammation, like ibuprofen or naproxen -retinoid products like tretinoin or isotretinoin -risperidone -some other antibiotics like clarithromycin or erythromycin -sucralfate -theophylline -warfarin This list may not describe all possible interactions. Give your health care provider a list of all the medicines, herbs, non-prescription drugs, or dietary supplements you use. Also tell them if you smoke, drink alcohol, or use illegal drugs. Some items may interact with your medicine. What should I watch for while using this medicine? Tell your doctor or health care professional if your symptoms do not improve or if they get worse. Drink several glasses of water a day and cut down on drinks that contain caffeine. You must not get dehydrated while taking this medicine. You may get drowsy or dizzy. Do not drive, use machinery, or do anything that needs mental  alertness until you know how this medicine affects you. Do not sit or stand up quickly, especially if you are an older patient. This reduces the risk of dizzy or fainting spells. This medicine can make you more sensitive to the sun. Keep out of the sun. If you cannot avoid being in the sun, wear protective clothing and use a sunscreen. Do not use sun lamps or tanning beds/booths. Contact your doctor if you get a sunburn. If you are a diabetic monitor your blood glucose carefully. If you get an unusual reading stop taking this medicine and call your doctor right away. Do not treat diarrhea with over-the-counter products. Contact your doctor if you have diarrhea that lasts more than 2 days or if the diarrhea is severe and watery. Avoid antacids, calcium, iron, and zinc products for 2 hours before and 2 hours after taking a dose of this medicine. What side effects may I notice from receiving this medicine? Side effects that you should report to your doctor or health care professional as soon as  possible: -allergic reactions like skin rash or hives, swelling of the face, lips, or tongue -changes in vision -confusion, nightmares or hallucinations -difficulty breathing -irregular heartbeat, chest pain -joint, muscle or tendon pain -pain or difficulty passing urine -persistent headache with or without blurred vision -redness, blistering, peeling or loosening of the skin, including inside the mouth -seizures -unusual pain, numbness, tingling, or weakness -vaginal irritation, discharge Side effects that usually do not require medical attention (report to your doctor or health care professional if they continue or are bothersome): -diarrhea -dry mouth -headache -stomach upset, nausea -trouble sleeping This list may not describe all possible side effects. Call your doctor for medical advice about side effects. You may report side effects to FDA at 1-800-FDA-1088. Where should I keep my medicine? Keep  out of the reach of children. Store at room temperature between 15 and 30 degrees C (59 and 86 degrees F). Keep in a tightly closed container. Throw away any unused medicine after the expiration date. NOTE: This sheet is a summary. It may not cover all possible information. If you have questions about this medicine, talk to your doctor, pharmacist, or health care provider.  2015, Elsevier/Gold Standard. (2012-11-04 07:45:07)  Metronidazole tablets or capsules What is this medicine? METRONIDAZOLE (me troe NI da zole) is an antiinfective. It is used to treat certain kinds of bacterial and protozoal infections. It will not work for colds, flu, or other viral infections. This medicine may be used for other purposes; ask your health care provider or pharmacist if you have questions. COMMON BRAND NAME(S): Flagyl What should I tell my health care provider before I take this medicine? They need to know if you have any of these conditions: -anemia or other blood disorders -disease of the nervous system -fungal or yeast infection -if you drink alcohol containing drinks -liver disease -seizures -an unusual or allergic reaction to metronidazole, or other medicines, foods, dyes, or preservatives -pregnant or trying to get pregnant -breast-feeding How should I use this medicine? Take this medicine by mouth with a full glass of water. Follow the directions on the prescription label. Take your medicine at regular intervals. Do not take your medicine more often than directed. Take all of your medicine as directed even if you think you are better. Do not skip doses or stop your medicine early. Talk to your pediatrician regarding the use of this medicine in children. Special care may be needed. Overdosage: If you think you have taken too much of this medicine contact a poison control center or emergency room at once. NOTE: This medicine is only for you. Do not share this medicine with others. What if I miss  a dose? If you miss a dose, take it as soon as you can. If it is almost time for your next dose, take only that dose. Do not take double or extra doses. What may interact with this medicine? Do not take this medicine with any of the following medications: -alcohol or any product that contains alcohol -amprenavir oral solution -cisapride -disulfiram -dofetilide -dronedarone -paclitaxel injection -pimozide -ritonavir oral solution -sertraline oral solution -sulfamethoxazole-trimethoprim injection -thioridazine -ziprasidone This medicine may also interact with the following medications: -birth control pills -cimetidine -lithium -other medicines that prolong the QT interval (cause an abnormal heart rhythm) -phenobarbital -phenytoin -warfarin This list may not describe all possible interactions. Give your health care provider a list of all the medicines, herbs, non-prescription drugs, or dietary supplements you use. Also tell them if you smoke, drink alcohol, or  use illegal drugs. Some items may interact with your medicine. What should I watch for while using this medicine? Tell your doctor or health care professional if your symptoms do not improve or if they get worse. You may get drowsy or dizzy. Do not drive, use machinery, or do anything that needs mental alertness until you know how this medicine affects you. Do not stand or sit up quickly, especially if you are an older patient. This reduces the risk of dizzy or fainting spells. Avoid alcoholic drinks while you are taking this medicine and for three days afterward. Alcohol may make you feel dizzy, sick, or flushed. If you are being treated for a sexually transmitted disease, avoid sexual contact until you have finished your treatment. Your sexual partner may also need treatment. What side effects may I notice from receiving this medicine? Side effects that you should report to your doctor or health care professional as soon as  possible: -allergic reactions like skin rash or hives, swelling of the face, lips, or tongue -confusion, clumsiness -difficulty speaking -discolored or sore mouth -dizziness -fever, infection -numbness, tingling, pain or weakness in the hands or feet -trouble passing urine or change in the amount of urine -redness, blistering, peeling or loosening of the skin, including inside the mouth -seizures -unusually weak or tired -vaginal irritation, dryness, or discharge Side effects that usually do not require medical attention (report to your doctor or health care professional if they continue or are bothersome): -diarrhea -headache -irritability -metallic taste -nausea -stomach pain or cramps -trouble sleeping This list may not describe all possible side effects. Call your doctor for medical advice about side effects. You may report side effects to FDA at 1-800-FDA-1088. Where should I keep my medicine? Keep out of the reach of children. Store at room temperature below 25 degrees C (77 degrees F). Protect from light. Keep container tightly closed. Throw away any unused medicine after the expiration date. NOTE: This sheet is a summary. It may not cover all possible information. If you have questions about this medicine, talk to your doctor, pharmacist, or health care provider.  2015, Elsevier/Gold Standard. (2012-11-04 14:08:39)  Acetaminophen; Oxycodone tablets What is this medicine? ACETAMINOPHEN; OXYCODONE (a set a MEE noe fen; ox i KOE done) is a pain reliever. It is used to treat mild to moderate pain. This medicine may be used for other purposes; ask your health care provider or pharmacist if you have questions. COMMON BRAND NAME(S): Endocet, Magnacet, Narvox, Percocet, Perloxx, Primalev, Primlev, Roxicet, Xolox What should I tell my health care provider before I take this medicine? They need to know if you have any of these conditions: -brain tumor -Crohn's disease, inflammatory  bowel disease, or ulcerative colitis -drug abuse or addiction -head injury -heart or circulation problems -if you often drink alcohol -kidney disease or problems going to the bathroom -liver disease -lung disease, asthma, or breathing problems -an unusual or allergic reaction to acetaminophen, oxycodone, other opioid analgesics, other medicines, foods, dyes, or preservatives -pregnant or trying to get pregnant -breast-feeding How should I use this medicine? Take this medicine by mouth with a full glass of water. Follow the directions on the prescription label. Take your medicine at regular intervals. Do not take your medicine more often than directed. Talk to your pediatrician regarding the use of this medicine in children. Special care may be needed. Patients over 29 years old may have a stronger reaction and need a smaller dose. Overdosage: If you think you have taken too  much of this medicine contact a poison control center or emergency room at once. NOTE: This medicine is only for you. Do not share this medicine with others. What if I miss a dose? If you miss a dose, take it as soon as you can. If it is almost time for your next dose, take only that dose. Do not take double or extra doses. What may interact with this medicine? -alcohol -antihistamines -barbiturates like amobarbital, butalbital, butabarbital, methohexital, pentobarbital, phenobarbital, thiopental, and secobarbital -benztropine -drugs for bladder problems like solifenacin, trospium, oxybutynin, tolterodine, hyoscyamine, and methscopolamine -drugs for breathing problems like ipratropium and tiotropium -drugs for certain stomach or intestine problems like propantheline, homatropine methylbromide, glycopyrrolate, atropine, belladonna, and dicyclomine -general anesthetics like etomidate, ketamine, nitrous oxide, propofol, desflurane, enflurane, halothane, isoflurane, and sevoflurane -medicines for depression, anxiety, or  psychotic disturbances -medicines for sleep -muscle relaxants -naltrexone -narcotic medicines (opiates) for pain -phenothiazines like perphenazine, thioridazine, chlorpromazine, mesoridazine, fluphenazine, prochlorperazine, promazine, and trifluoperazine -scopolamine -tramadol -trihexyphenidyl This list may not describe all possible interactions. Give your health care provider a list of all the medicines, herbs, non-prescription drugs, or dietary supplements you use. Also tell them if you smoke, drink alcohol, or use illegal drugs. Some items may interact with your medicine. What should I watch for while using this medicine? Tell your doctor or health care professional if your pain does not go away, if it gets worse, or if you have new or a different type of pain. You may develop tolerance to the medicine. Tolerance means that you will need a higher dose of the medication for pain relief. Tolerance is normal and is expected if you take this medicine for a long time. Do not suddenly stop taking your medicine because you may develop a severe reaction. Your body becomes used to the medicine. This does NOT mean you are addicted. Addiction is a behavior related to getting and using a drug for a non-medical reason. If you have pain, you have a medical reason to take pain medicine. Your doctor will tell you how much medicine to take. If your doctor wants you to stop the medicine, the dose will be slowly lowered over time to avoid any side effects. You may get drowsy or dizzy. Do not drive, use machinery, or do anything that needs mental alertness until you know how this medicine affects you. Do not stand or sit up quickly, especially if you are an older patient. This reduces the risk of dizzy or fainting spells. Alcohol may interfere with the effect of this medicine. Avoid alcoholic drinks. There are different types of narcotic medicines (opiates) for pain. If you take more than one type at the same time, you  may have more side effects. Give your health care provider a list of all medicines you use. Your doctor will tell you how much medicine to take. Do not take more medicine than directed. Call emergency for help if you have problems breathing. The medicine will cause constipation. Try to have a bowel movement at least every 2 to 3 days. If you do not have a bowel movement for 3 days, call your doctor or health care professional. Do not take Tylenol (acetaminophen) or medicines that have acetaminophen with this medicine. Too much acetaminophen can be very dangerous. Many nonprescription medicines contain acetaminophen. Always read the labels carefully to avoid taking more acetaminophen. What side effects may I notice from receiving this medicine? Side effects that you should report to your doctor or health care professional as  soon as possible: -allergic reactions like skin rash, itching or hives, swelling of the face, lips, or tongue -breathing difficulties, wheezing -confusion -light headedness or fainting spells -severe stomach pain -unusually weak or tired -yellowing of the skin or the whites of the eyes Side effects that usually do not require medical attention (report to your doctor or health care professional if they continue or are bothersome): -dizziness -drowsiness -nausea -vomiting This list may not describe all possible side effects. Call your doctor for medical advice about side effects. You may report side effects to FDA at 1-800-FDA-1088. Where should I keep my medicine? Keep out of the reach of children. This medicine can be abused. Keep your medicine in a safe place to protect it from theft. Do not share this medicine with anyone. Selling or giving away this medicine is dangerous and against the law. Store at room temperature between 20 and 25 degrees C (68 and 77 degrees F). Keep container tightly closed. Protect from light. This medicine may cause accidental overdose and death if  it is taken by other adults, children, or pets. Flush any unused medicine down the toilet to reduce the chance of harm. Do not use the medicine after the expiration date. NOTE: This sheet is a summary. It may not cover all possible information. If you have questions about this medicine, talk to your doctor, pharmacist, or health care provider.  2015, Elsevier/Gold Standard. (2012-11-21 13:17:35)

## 2014-04-04 LAB — GC/CHLAMYDIA PROBE AMP
CT PROBE, AMP APTIMA: NEGATIVE
GC Probe RNA: NEGATIVE

## 2014-04-20 ENCOUNTER — Emergency Department (HOSPITAL_COMMUNITY)
Admission: EM | Admit: 2014-04-20 | Discharge: 2014-04-20 | Disposition: A | Discharge status: Home / Self Care | Payer: Medicaid Other | Attending: Emergency Medicine | Admitting: Emergency Medicine

## 2014-04-20 ENCOUNTER — Encounter (HOSPITAL_COMMUNITY): Payer: Self-pay | Admitting: Emergency Medicine

## 2014-04-20 DIAGNOSIS — J45909 Unspecified asthma, uncomplicated: Secondary | ICD-10-CM | POA: Diagnosis not present

## 2014-04-20 DIAGNOSIS — Z79899 Other long term (current) drug therapy: Secondary | ICD-10-CM | POA: Insufficient documentation

## 2014-04-20 DIAGNOSIS — Z792 Long term (current) use of antibiotics: Secondary | ICD-10-CM | POA: Insufficient documentation

## 2014-04-20 DIAGNOSIS — H9203 Otalgia, bilateral: Secondary | ICD-10-CM | POA: Diagnosis not present

## 2014-04-20 DIAGNOSIS — B9789 Other viral agents as the cause of diseases classified elsewhere: Secondary | ICD-10-CM

## 2014-04-20 DIAGNOSIS — R0981 Nasal congestion: Secondary | ICD-10-CM | POA: Diagnosis present

## 2014-04-20 DIAGNOSIS — J329 Chronic sinusitis, unspecified: Secondary | ICD-10-CM | POA: Insufficient documentation

## 2014-04-20 MED ORDER — MOMETASONE FUROATE 50 MCG/ACT NA SUSP: 2.0000 | Freq: Every day | NASAL | Status: DC

## 2014-04-20 MED ORDER — CETIRIZINE HCL 10 MG PO CAPS: 10.0000 mg | ORAL_CAPSULE | Freq: Every day | ORAL | Status: DC

## 2014-04-20 NOTE — Discharge Instructions (Signed)
Sinusitis °Sinusitis is redness, soreness, and inflammation of the paranasal sinuses. Paranasal sinuses are air pockets within the bones of your face (beneath the eyes, the middle of the forehead, or above the eyes). In healthy paranasal sinuses, mucus is able to drain out, and air is able to circulate through them by way of your nose. However, when your paranasal sinuses are inflamed, mucus and air can become trapped. This can allow bacteria and other germs to grow and cause infection. °Sinusitis can develop quickly and last only a short time (acute) or continue over a long period (chronic). Sinusitis that lasts for more than 12 weeks is considered chronic.  °CAUSES  °Causes of sinusitis include: °· Allergies. °· Structural abnormalities, such as displacement of the cartilage that separates your nostrils (deviated septum), which can decrease the air flow through your nose and sinuses and affect sinus drainage. °· Functional abnormalities, such as when the small hairs (cilia) that line your sinuses and help remove mucus do not work properly or are not present. °SIGNS AND SYMPTOMS  °Symptoms of acute and chronic sinusitis are the same. The primary symptoms are pain and pressure around the affected sinuses. Other symptoms include: °· Upper toothache. °· Earache. °· Headache. °· Bad breath. °· Decreased sense of smell and taste. °· A cough, which worsens when you are lying flat. °· Fatigue. °· Fever. °· Thick drainage from your nose, which often is green and may contain pus (purulent). °· Swelling and warmth over the affected sinuses. °DIAGNOSIS  °Your health care provider will perform a physical exam. During the exam, your health care provider may: °· Look in your nose for signs of abnormal growths in your nostrils (nasal polyps). °· Tap over the affected sinus to check for signs of infection. °· View the inside of your sinuses (endoscopy) using an imaging device that has a light attached (endoscope). °If your health  care provider suspects that you have chronic sinusitis, one or more of the following tests may be recommended: °· Allergy tests. °· Nasal culture. A sample of mucus is taken from your nose, sent to a lab, and screened for bacteria. °· Nasal cytology. A sample of mucus is taken from your nose and examined by your health care provider to determine if your sinusitis is related to an allergy. °TREATMENT  °Most cases of acute sinusitis are related to a viral infection and will resolve on their own within 10 days. Sometimes medicines are prescribed to help relieve symptoms (pain medicine, decongestants, nasal steroid sprays, or saline sprays).  °However, for sinusitis related to a bacterial infection, your health care provider will prescribe antibiotic medicines. These are medicines that will help kill the bacteria causing the infection.  °Rarely, sinusitis is caused by a fungal infection. In theses cases, your health care provider will prescribe antifungal medicine. °For some cases of chronic sinusitis, surgery is needed. Generally, these are cases in which sinusitis recurs more than 3 times per year, despite other treatments. °HOME CARE INSTRUCTIONS  °· Drink plenty of water. Water helps thin the mucus so your sinuses can drain more easily. °· Use a humidifier. °· Inhale steam 3 to 4 times a day (for example, sit in the bathroom with the shower running). °· Apply a warm, moist washcloth to your face 3 to 4 times a day, or as directed by your health care provider. °· Use saline nasal sprays to help moisten and clean your sinuses. °· Take medicines only as directed by your health care provider. °·   If you were prescribed either an antibiotic or antifungal medicine, finish it all even if you start to feel better. °SEEK IMMEDIATE MEDICAL CARE IF: °· You have increasing pain or severe headaches. °· You have nausea, vomiting, or drowsiness. °· You have swelling around your face. °· You have vision problems. °· You have a stiff  neck. °· You have difficulty breathing. °MAKE SURE YOU:  °· Understand these instructions. °· Will watch your condition. °· Will get help right away if you are not doing well or get worse. °Document Released: 03/30/2005 Document Revised: 08/14/2013 Document Reviewed: 04/14/2011 °ExitCare® Patient Information ©2015 [employer]. This information is not intended to replace advice given to you by your health care provider. Make sure you discuss any questions you have with your health care provider. ° °Emergency Department Resource Guide °1) Find a Doctor and Pay Out of Pocket °Although you won't have to find out who is covered by your insurance plan, it is a good idea to ask around and get recommendations. You will then need to call the office and see if the doctor you have chosen will accept you as a new patient and what types of options they offer for patients who are self-pay. Some doctors offer discounts or will set up payment plans for their patients who do not have insurance, but you will need to ask so you aren't surprised when you get to your appointment. ° °2) Contact Your Local Health Department °Not all health departments have doctors that can see patients for sick visits, but many do, so it is worth a call to see if yours does. If you don't know where your local health department is, you can check in your phone book. The CDC also has a tool to help you locate your state's health department, and many state websites also have listings of all of their local health departments. ° °3) Find a Walk-in Clinic °If your illness is not likely to be very severe or complicated, you may want to try a walk in clinic. These are popping up all over the country in pharmacies, drugstores, and shopping centers. They're usually staffed by nurse practitioners or physician assistants that have been trained to treat common illnesses and complaints. They're usually fairly quick and inexpensive. However, if you have serious medical  issues or chronic medical problems, these are probably not your best option. ° °No Primary Care Doctor: °- Call [facility] at  832-8000 - they can help you locate a primary care doctor that  accepts your insurance, provides certain services, etc. °- Physician Referral Service- 1-800-533-3463 ° °Chronic Pain Problems: °Organization         Address  Phone   Notes  °[facility]  (336) 297-2271 Patients need to be referred by their primary care doctor.  ° °Medication Assistance: °Organization         Address  Phone   Notes  °[place] [facility] [address] °[place], [place] [address] (336) 641-8030 --Must be a resident of [place] °-- Must have NO insurance coverage whatsoever (no Medicaid/ Medicare, etc.) °-- The pt. MUST have a primary care doctor that directs their care regularly and follows them in the community °  °[facility]  (866) 331-1348   °[employer]  (888) 892-1162   ° °Agencies that provide inexpensive medical care: °Organization         Address  Phone   Notes  °[facility]  (336)   832-8035   °[facility]    (336) 832-7272   °[facility] [address] °[place], [place] [address] (336) 832-4777   °[facility] [address], °[place] (336) 271-4999   °[facility]    (336) 373-0678   °[facility]    (336) 272-1050   °[facility] ° [address], [place] Phone:  (336) 832-4444, Fax:  (336) 832-4440 Hours of Operation:  9 am - 6 pm, M-F.  Also accepts Medicaid/Medicare and self-pay.  °[facility] ° [address], [place] Phone: (336) [phone], Fax: (336) [phone]. Hours of Operation:  8:30 am - 5:30 pm, M-F.  Also accepts Medicaid and self-pay.  °[facility] [place] [address], [place] Phone: (336) 878-6027   °[facility] [address], [place], [place]  (336)723-1848, Ext. 123 Mondays & Thursdays: 7-9 AM.  First 15 patients are seen on a first come, first serve basis. °  ° °Medicaid-accepting [place] Providers: ° °Organization         Address  Phone   Notes  °[facility] [address], [place] (336) 641-2100 Also accepts self-pay patients.  °[facility] [address], [place] ° (336) 856-9996   °[facility] [address], [place] (336) 288-8857   °[facility] [address] [place] [address], [place] (336) 299-7000   °Veita Bland [address], [place]  ° (336) 373-1557 Only accepts [place] Access Medicaid patients after they have their name applied to their card.  ° °Self-Pay (no insurance) in [place]: ° °Organization         Address  Phone   Notes  °Sickle Cell Patients, [facility] [address], [place] (336) 832-1970   °[facility] [address], [place] (336) 832-4400   ° [facility] ° [address], [place] (336) 992-4800   °[facility]/Dr. Osei-Bonsu ° [address] [place] [address], [place] or [address], [place] (336) [phone] Phone number for both [place] and [place] locations is the same.  °[facility] [address], [place] (336) 299-0000   °[facility] [address] [place] [address], [place] or [address] (336) 852-7530 °(336) 878-2260   °[facility] [address],  (336) 350-1642, phone; (336) 294-5005, fax Sees patients 1st and 3rd Saturday of every month.  Must not qualify for public or private insurance (i.e. Medicaid, Medicare, [place] Health Choice, Veterans' Benefits) • Household income should be no more than 200% of the poverty level •The clinic cannot treat you if you are pregnant or think you are pregnant • Sexually transmitted  diseases are not treated at the clinic.  ° ° °Dental Care: °Organization         Address  Phone  Notes  °[facility] [address],  (336) 641-6152 Accepts children up to age 21 who are enrolled in Medicaid or [place] Health Choice; pregnant women with a Medicaid card; and children who have applied for Medicaid or [place] Health Choice, but were declined, whose parents can pay a reduced fee at time of service.  °[facility] [place]  [address], [place] (336) 641-7733 Accepts children up to age 21 who are enrolled in Medicaid or [place] Health Choice; pregnant women with a Medicaid card; and children who have applied for Medicaid or [place] Health Choice, but were declined, whose parents can pay a reduced fee at time of service.  °[facility] ° [address], [place] (336) 641-4533 Patients are seen by appointment only. Walk-ins are not accepted. [facility] will see patients 18 years of age and older. °Monday - Tuesday (8am-5pm) °Most Wednesdays (8:30-5pm) °$30 per visit, cash only  °[facility] ° [address], [place] (336) 641-4533 Patients are seen by appointment only. Walk-ins are not accepted. [facility] will see patients 18 years of age and older. °One Wednesday Evening (Monthly: Volunteer Based).  $30 per visit, cash only  °[facility]  (919) 537-3737 for adults; Children under age 4, call Graduate Pediatric Dentistry at ([phone]) [phone]. Children aged 4-14, please call ([phone]) [phone] to request a pediatric application. ° Dental services are provided in all areas of dental care including fillings, crowns and bridges, complete and partial dentures, implants, gum treatment, root canals, and extractions. Preventive care is also provided. Treatment is provided to both adults and children. °Patients are selected via a  lottery and there is often a waiting list. °  °[facility] [address], °[place] ° (336) 763-8833 [email or web address] °  °[facility] [address], [place], [place] (336)723-1848, Ext. 123 Second and Fourth Thursday of each month, opens at 6:30 AM; Clinic ends at 9 AM.  Patients are seen on a first-come first-served basis, and a limited number are seen during each clinic.  ° °[facility] ° [address], [place], [place] (336) 723-7904   Eligibility Requirements °You must have lived in [place], [place], or [place] counties for at least the last three months. °  You cannot be eligible for state or federal sponsored healthcare insurance, including Veterans Administration, Medicaid, or Medicare. °  You generally cannot be eligible for healthcare insurance through your employer.  °  How to apply: °Eligibility screenings are held every Tuesday and Wednesday afternoon from 1:00 pm until 4:00 pm. You do not need an appointment for the interview!  °[facility] [address], [place], [place] 336-631-2330   °[facility]  336-342-8273   °[facility]  336-703-3100   °[facility]  336-570-6415   ° °Behavioral Health Resources in the Community: °Intensive Outpatient Programs °Organization         Address  Phone  Notes  °[facility] [address], [place], [place] 336-878-6098   °[facility] [address], [place], [place] 336-832-9800   °[facility] [address], [place], [place] ° 336-882-2125   °[facility] [address],  °[place], [place] 1-800-853-5163 or 336-641-4981   °Substance Abuse Resources °Organization         Address  Phone  Notes  °[facility]  336-882-2125   °[facility]  336-784-9470   °[facility]  336-285-9073   °[facility]  336-845-3988   °Residential &  Outpatient Substance Abuse Program  1-800-659-3381   °Psychological Services °Organization         Address  Phone  Notes  °[facility]  336- 832-9600   °[facility]  336- 378-7881   °[facility] [address], [place] 1-800-853-5163 or 336-641-4981   ° °Mobile Crisis Teams °Organization         Address  Phone  Notes  °[facility], Mobile Crisis Care Unit  1-877-626-1772   °Assertive °[facility] ° [address] [place], [place] 336-834-9664   °Sharon DeEsch [address] °[place] 336-554-5454   ° °Self-Help/Support Groups °Organization         Address  Phone             Notes  °[facility] - variety of support groups  336- 373-1402 Call for more information  °Narcotics Anonymous (NA), [facility] [address], °[place]  2 meetings at this location  ° °Residential Treatment Programs °Organization         Address  Phone  Notes  °[facility] [address],    °[place]  1-866-801-8205   °[facility] ° [address], [place], [place] 704-293-8524   °[facility] [address], [place] 336-845-3988 Admissions: 8am-3pm M-F  °[facility] [address]    °[place], [place] 336-841-1104   °[facility] [address], [place], [place] 336-379-7146   °[facility] [address]  °[place], [place] 336-285-9073   °[facility] - Intensive Outpatient [address], [place], [place] 336-852-3033   °[facility] [address]  °[place], [place] 1-877-615-2722 or 336-784-9470   °[facility] [address] [place], [place] 336-227-7417 Accepts Medicaid  °[facility] [address]  ° [place] 1-800-659-3381 Substance Abuse/Addiction Treatment  ° °[place] Behavioral Health Resources °Organization          Address  Phone  Notes  °[facility]  (888) 581-9988   °Julie Brannon, PhD [address] [place], [place]   (336) 349-5553 or (336) 951-0000   °[facility]   [address] °[place], [place] (336) 349-4454   °[facility] [address], [place], [place] (336) 342-8316 Insurance/Medicaid/sponsorship through [facility]  °[facility] [address]                                    [place], [place] (336) 342-8316 Therapy/tele-psych/case  °[facility] [address]  ° [place], [place] (336) 349-2233    °Dr. Arfeen  (336) 349-4544   °[facility] [place]  [employer] [place] [facility] 1) [address],  °2) [address], [place] °3)  [address], [place] (336) 349-3220 °(336) 342-7768 ° °(336) 342-8140   °[place] [facility] (336) 342-1394 or (336) 342-3537 (After Hours)    ° ° °

## 2014-04-20 NOTE — ED Notes (Signed)
Pt c/o URI sx with generalized body aches and cough with congestion; pt sts some nosebleeds at times but not at present

## 2014-04-20 NOTE — ED Provider Notes (Signed)
CSN: [id number]     Arrival date & time 04/20/14  1810 History  This chart was scribed for Eben Burow, PA-C, working with [employer]. Rubin Payor, MD by Elon Spanner, ED Scribe. This patient was seen in room TR09C/TR09C and the patient's care was started at 6:34 PM.   Chief Complaint  Patient presents with  . URI   The history is provided by the patient. No language interpreter was used.   HPI Comments: Gilberto Streck is a 29 y.o. female who presents to the Emergency Department complaining of congestion with associated rhinnorrea, chills, blood-streaking in sputum when she blows her nose and bilateral ear pain onset 2-3 days ago.   Patient has taken Tylenol without relief.  Patient reports a history of asthma and has been using her albuterol inhaler.  She also reports being diagnosed with fluid in her uterus.   Patient denies fever, SOB, sore throat productive cough.   Patient is allergic to Rocephin.   Past Medical History  Diagnosis Date  . Asthma    Past Surgical History  Procedure Laterality Date  . Cesarean section     History reviewed. No pertinent family history. History  Substance Use Topics  . Smoking status: Never Smoker   . Smokeless tobacco: Not on file  . Alcohol Use: No   OB History    No data available     Review of Systems  Constitutional: Positive for chills.  Respiratory: Negative for shortness of breath.   All other systems reviewed and are negative.     Allergies  Rocephin  Home Medications   Prior to Admission medications   Medication Sig Start Date End Date Taking? Authorizing Provider  albuterol (PROVENTIL HFA;VENTOLIN HFA) 108 (90 BASE) MCG/ACT inhaler Inhale into the lungs every 6 (six) hours as needed for wheezing or shortness of breath.    Historical Provider, MD  Cetirizine HCl (ZYRTEC ALLERGY) 10 MG CAPS Take 1 capsule (10 mg total) by mouth at bedtime. 04/20/14   Rondell Pardon A Forcucci, PA-C  ibuprofen (ADVIL,MOTRIN) 600 MG tablet Take 1 tablet  (600 mg total) by mouth every 6 (six) hours as needed. Patient not taking: Reported on 04/03/2014 06/19/13   Derwood Kaplan, MD  levofloxacin (LEVAQUIN) 500 MG tablet Take 1 tablet (500 mg total) by mouth daily. 04/03/14   Dione Booze, MD  medroxyPROGESTERone (DEPO-PROVERA) 150 MG/ML injection Inject 150 mg into the muscle every 3 (three) months.  02/22/14   Historical Provider, MD  metroNIDAZOLE (FLAGYL) 500 MG tablet Take 1 tablet (500 mg total) by mouth 2 (two) times daily. 04/03/14   Dione Booze, MD  mometasone (NASONEX) 50 MCG/ACT nasal spray Place 2 sprays into the nose daily. 04/20/14   Gwen Sarvis A Forcucci, PA-C  oxyCODONE-acetaminophen (PERCOCET) 5-325 MG per tablet Take 1 tablet by mouth every 4 (four) hours as needed for moderate pain. 04/03/14   Dione Booze, MD   BP 113/66 mmHg  Pulse 84  Temp(Src) 98.6 F (37 C) (Oral)  Resp 18  SpO2 100%  LMP 04/03/2014 Physical Exam  Constitutional: She is oriented to person, place, and time. She appears well-developed and well-nourished. No distress.  HENT:  Head: Normocephalic and atraumatic.  Bilateral TM's normal.  No conjunctival ejection.  Mucosal edema in nose.  Mid-line uvula rise.  No visible exudate.    Eyes: Conjunctivae and EOM are normal. Pupils are equal, round, and reactive to light.  Neck: Neck supple. No tracheal deviation present.  Cardiovascular: Normal rate, regular rhythm and normal  heart sounds.  Exam reveals no gallop and no friction rub.   No murmur heard. Pulmonary/Chest: Effort normal and breath sounds normal. No respiratory distress. She has no wheezes. She has no rhonchi. She has no rales.  Lungs CTA.  Musculoskeletal: Normal range of motion.  Lymphadenopathy:    She has no cervical adenopathy.  Neurological: She is alert and oriented to person, place, and time.  Skin: Skin is warm and dry.  Psychiatric: She has a normal mood and affect. Her behavior is normal.  Nursing note and vitals reviewed.   ED Course   Procedures (including critical care time)  DIAGNOSTIC STUDIES: Oxygen Saturation is 100% on RA, normal by my interpretation.    COORDINATION OF CARE:  6:36 PM Informed patient of suspicion of viral URI.  Patient advised to alternate Tylenol and ibuprofen as well as use Neti pot.  Will prescribe anti-histamine and steroid spray.  Patient advised of return precautions including SOB, productive cough, fever that doesn't respond to ibuprofen/Tylenol. Patient acknowledges and agrees with plan.    Labs Review Labs Reviewed - No data to display  Imaging Review No results found.   EKG Interpretation None      MDM   Final diagnoses:  Viral sinusitis   Patient is a 29 year old female who presents emergency room for evaluation of congestion headache and intermittent bloody nose. Physical exam reveals an alert and nontoxic-appearing female with mucosal edema of the nose and mild sinus tenderness to palpation. Lungs are clear to auscultation. Vital signs are stable. Suspect that this is cold versus viral sinusitis. We'll discharge home with symptomatic treatment including Nasonex, Zyrtec, Nettie pot, and alternating Tylenol and ibuprofen as needed. Patient return for worsening signs of infection which we discussed. Patient states understanding and agreement with the above plan. Patient is stable for discharge at this time.  I personally performed the services described in this documentation, which was scribed in my presence. The recorded information has been reviewed and is accurate.     Eben Burowourtney A Forcucci, PA-C 04/20/14 1847  Juliet RudeNathan R. Rubin PayorPickering, MD 04/21/14 [id number]

## 2014-05-17 ENCOUNTER — Encounter (HOSPITAL_COMMUNITY): Payer: Self-pay | Admitting: Emergency Medicine

## 2014-05-17 ENCOUNTER — Emergency Department (HOSPITAL_COMMUNITY): Payer: Medicaid Other

## 2014-05-17 ENCOUNTER — Other Ambulatory Visit: Payer: Self-pay

## 2014-05-17 ENCOUNTER — Emergency Department (HOSPITAL_COMMUNITY)
Admission: EM | Admit: 2014-05-17 | Discharge: 2014-05-17 | Disposition: A | Discharge status: Home / Self Care | Payer: Medicaid Other | Attending: Emergency Medicine | Admitting: Emergency Medicine

## 2014-05-17 DIAGNOSIS — Z792 Long term (current) use of antibiotics: Secondary | ICD-10-CM | POA: Insufficient documentation

## 2014-05-17 DIAGNOSIS — R079 Chest pain, unspecified: Secondary | ICD-10-CM | POA: Insufficient documentation

## 2014-05-17 DIAGNOSIS — J45909 Unspecified asthma, uncomplicated: Secondary | ICD-10-CM | POA: Diagnosis not present

## 2014-05-17 DIAGNOSIS — R109 Unspecified abdominal pain: Secondary | ICD-10-CM | POA: Insufficient documentation

## 2014-05-17 DIAGNOSIS — R0789 Other chest pain: Secondary | ICD-10-CM

## 2014-05-17 DIAGNOSIS — Z7952 Long term (current) use of systemic steroids: Secondary | ICD-10-CM | POA: Diagnosis not present

## 2014-05-17 DIAGNOSIS — Z79899 Other long term (current) drug therapy: Secondary | ICD-10-CM | POA: Diagnosis not present

## 2014-05-17 LAB — I-STAT CHEM 8, ED
BUN: 13 mg/dL (ref 6–23)
CALCIUM ION: 1.15 mmol/L (ref 1.12–1.23)
CHLORIDE: 105 mmol/L (ref 96–112)
Creatinine, Ser: 1 mg/dL (ref 0.50–1.10)
GLUCOSE: 95 mg/dL (ref 70–99)
HCT: 42 % (ref 36.0–46.0)
HEMOGLOBIN: 14.3 g/dL (ref 12.0–15.0)
POTASSIUM: 3.7 mmol/L (ref 3.5–5.1)
Sodium: 141 mmol/L (ref 135–145)
TCO2: 20 mmol/L (ref 0–100)

## 2014-05-17 LAB — CBC WITH DIFFERENTIAL/PLATELET
Basophils Absolute: 0 10*3/uL (ref 0.0–0.1)
Basophils Relative: 0 % (ref 0–1)
EOS ABS: 0.1 10*3/uL (ref 0.0–0.7)
EOS PCT: 3 % (ref 0–5)
HEMATOCRIT: 39.1 % (ref 36.0–46.0)
Hemoglobin: 13.6 g/dL (ref 12.0–15.0)
LYMPHS ABS: 2.6 10*3/uL (ref 0.7–4.0)
LYMPHS PCT: 53 % — AB (ref 12–46)
MCH: 29.2 pg (ref 26.0–34.0)
MCHC: 34.8 g/dL (ref 30.0–36.0)
MCV: 84.1 fL (ref 78.0–100.0)
Monocytes Absolute: 0.3 10*3/uL (ref 0.1–1.0)
Monocytes Relative: 6 % (ref 3–12)
Neutro Abs: 1.8 10*3/uL (ref 1.7–7.7)
Neutrophils Relative %: 37 % — ABNORMAL LOW (ref 43–77)
PLATELETS: 122 10*3/uL — AB (ref 150–400)
RBC: 4.65 MIL/uL (ref 3.87–5.11)
RDW: 12.7 % (ref 11.5–15.5)
WBC: 4.9 10*3/uL (ref 4.0–10.5)

## 2014-05-17 LAB — URINALYSIS, ROUTINE W REFLEX MICROSCOPIC
Bilirubin Urine: NEGATIVE
GLUCOSE, UA: NEGATIVE mg/dL
Hgb urine dipstick: NEGATIVE
Ketones, ur: 15 mg/dL — AB
Leukocytes, UA: NEGATIVE
NITRITE: NEGATIVE
Protein, ur: NEGATIVE mg/dL
SPECIFIC GRAVITY, URINE: 1.035 — AB (ref 1.005–1.030)
UROBILINOGEN UA: 1 mg/dL (ref 0.0–1.0)
pH: 6 (ref 5.0–8.0)

## 2014-05-17 LAB — POC URINE PREG, ED: Preg Test, Ur: NEGATIVE

## 2014-05-17 MED ORDER — KETOROLAC TROMETHAMINE 60 MG/2ML IM SOLN
60.0000 mg | Freq: Once | INTRAMUSCULAR | Status: AC
  Administered 2014-05-17: 60 mg via INTRAMUSCULAR
  Filled 2014-05-17: qty 2

## 2014-05-17 MED ORDER — IBUPROFEN 800 MG PO TABS: 800.0000 mg | ORAL_TABLET | Freq: Three times a day (TID) | ORAL | Status: DC

## 2014-05-17 NOTE — ED Notes (Addendum)
Pt st's she has been having left chest pain x's 2 days.  Also c/o mid to lower abd pain x's 2-3 months.  Also st's she has felt short of breath at night.  Pt also c/o headache x's 1 month

## 2014-05-17 NOTE — ED Provider Notes (Signed)
CSN: [id number]     Arrival date & time 05/17/14  1738 History   First MD Initiated Contact with Patient 05/17/14 1900     Chief Complaint  Patient presents with  . Chest Pain     (Consider location/radiation/quality/duration/timing/severity/associated sxs/prior Treatment) HPI 29 year old female past history of asthma persist ED complaining of constant chest pain for the past 2 days located primarily in the left chest. Patient states she has attempted Goody's powder which has not helped. Patient reports pain is worsened with lying down and at night. She denies a pleuritic component. Denies leg pain or swelling. No diaphoresis, nausea or vomiting. No recent URI symptoms or fevers or chills. No history of similar symptoms in the past. No history of PE or DVT. Patient was diagnosed with viral sinusitis last month which she states she is still dealing with. She was also diagnosed with PID the month prior to that which she says she is having some generalized abdominal pain from but denies any other GU symptoms at this time.    Past Medical History  Diagnosis Date  . Asthma    Past Surgical History  Procedure Laterality Date  . Cesarean section     No family history on file. History  Substance Use Topics  . Smoking status: Never Smoker   . Smokeless tobacco: Not on file  . Alcohol Use: No   OB History    No data available     Review of Systems  Constitutional: Negative for fever and chills.  HENT: Negative for congestion, rhinorrhea and sore throat.   Eyes: Negative for visual disturbance.  Respiratory: Negative for cough and shortness of breath.   Cardiovascular: Positive for chest pain. Negative for palpitations and leg swelling.  Gastrointestinal: Positive for abdominal pain. Negative for nausea, vomiting, diarrhea and constipation.  Genitourinary: Negative for dysuria, hematuria, vaginal bleeding and vaginal discharge.  Musculoskeletal: Positive for myalgias. Negative for back  pain and neck pain.  Skin: Negative for rash.  Neurological: Negative for weakness and headaches.  All other systems reviewed and are negative.     Allergies  Rocephin  Home Medications   Prior to Admission medications   Medication Sig Start Date End Date Taking? Authorizing Provider  albuterol (PROVENTIL HFA;VENTOLIN HFA) 108 (90 BASE) MCG/ACT inhaler Inhale into the lungs every 6 (six) hours as needed for wheezing or shortness of breath.    Historical Provider, MD  Cetirizine HCl (ZYRTEC ALLERGY) 10 MG CAPS Take 1 capsule (10 mg total) by mouth at bedtime. 04/20/14   Courtney A Forcucci, PA-C  ibuprofen (ADVIL,MOTRIN) 600 MG tablet Take 1 tablet (600 mg total) by mouth every 6 (six) hours as needed. Patient not taking: Reported on 04/03/2014 06/19/13   Derwood Kaplan, MD  levofloxacin (LEVAQUIN) 500 MG tablet Take 1 tablet (500 mg total) by mouth daily. 04/03/14   Dione Booze, MD  medroxyPROGESTERone (DEPO-PROVERA) 150 MG/ML injection Inject 150 mg into the muscle every 3 (three) months.  02/22/14   Historical Provider, MD  metroNIDAZOLE (FLAGYL) 500 MG tablet Take 1 tablet (500 mg total) by mouth 2 (two) times daily. 04/03/14   Dione Booze, MD  mometasone (NASONEX) 50 MCG/ACT nasal spray Place 2 sprays into the nose daily. 04/20/14   Courtney A Forcucci, PA-C  oxyCODONE-acetaminophen (PERCOCET) 5-325 MG per tablet Take 1 tablet by mouth every 4 (four) hours as needed for moderate pain. 04/03/14   Dione Booze, MD   BP 111/72 mmHg  Pulse 81  Temp(Src) 98.2 F (36.8  C) (Oral)  Resp 16  SpO2 100% Physical Exam  Constitutional: She is oriented to person, place, and time. She appears well-developed and well-nourished. No distress.  HENT:  Head: Normocephalic and atraumatic.  Eyes: Conjunctivae are normal.  Neck: Normal range of motion.  Cardiovascular: Normal rate, regular rhythm, normal heart sounds and intact distal pulses.   No murmur heard. Pulmonary/Chest: Effort normal and breath  sounds normal. No respiratory distress. She has no wheezes. She has no rales. She exhibits tenderness (easily reproducible with palpation).  Abdominal: Soft. Bowel sounds are normal. She exhibits no distension and no mass. There is no tenderness. There is no rebound and no guarding.  Musculoskeletal: Normal range of motion.  Neurological: She is alert and oriented to person, place, and time. No cranial nerve deficit.  Skin: Skin is warm and dry. She is not diaphoretic.  Psychiatric: She has a normal mood and affect.  Nursing note and vitals reviewed.   ED Course  Procedures (including critical care time) Labs Review Labs Reviewed  CBC WITH DIFFERENTIAL/PLATELET - Abnormal; Notable for the following:    Platelets 122 (*)    Neutrophils Relative % 37 (*)    Lymphocytes Relative 53 (*)    All other components within normal limits  URINALYSIS, ROUTINE W REFLEX MICROSCOPIC - Abnormal; Notable for the following:    Specific Gravity, Urine 1.035 (*)    Ketones, ur 15 (*)    All other components within normal limits  I-STAT CHEM 8, ED  POC URINE PREG, ED    Imaging Review Dg Chest 2 View  05/17/2014   CLINICAL DATA:  Left-sided chest pain and shortness of breath for 3 days.  EXAM: CHEST  2 VIEW  COMPARISON:  None.  FINDINGS: Heart size and mediastinal contours are within normal limits. Both lungs are clear. Visualized skeletal structures are unremarkable.  IMPRESSION: Normal exam.   Electronically Signed   By: Drusilla Kannerhomas  Dalessio M.D.   On: 05/17/2014 20:31     EKG Interpretation   Date/Time:  Thursday May 17 2014 17:43:41 EST Ventricular Rate:  95 PR Interval:  126 QRS Duration: 84 QT Interval:  342 QTC Calculation: 429 R Axis:   43 Text Interpretation:  Normal sinus rhythm Normal ECG When compared with  ECG of 06/19/2013, No significant change was found Confirmed by [facility]  [facility],  DAVID ([id number]) on 05/17/2014 9:40:24 PM      MDM   Final diagnoses:  None   Sherald Earlene PlaterDavis is a 29  y.o. female who p/w CP. ABCs intact. HDS, NAD.  Pain does not radiate to back, blood pressure is stable. No mediastinal widening on CXR. Dissection unlikely. Pt's breath sounds are equal bilaterally. No PTX on CXR. PTX unlikely. Pain is not associated with meals. GERD unlikely. Pt has Wells score of 0, PERC neg; PE unlikely. Pain is not positional and EKG is WNL. Pericarditis unlikely. No h/o cocaine use. Pt reports no cough or fever. No infiltrate on CXR. PNA unlikely. No Hx of exertion, trauma. Pain is reproducible. Costochondritis likely.  Clinical Impression: 1. Chest pain, musculoskeletal     Disposition: Discharge  Condition: Good  I have discussed the results, Dx and Tx plan with the pt(& family if present). He/she/they expressed understanding and agree(s) with the plan. Discharge instructions discussed at great length. Strict return precautions discussed and pt &/or family have verbalized understanding of the instructions. No further questions at time of discharge.    New Prescriptions   IBUPROFEN (ADVIL,MOTRIN) 800 MG  TABLET    Take 1 tablet (800 mg total) by mouth 3 (three) times daily.    Follow Up: [facility]     [address] [place] [address] [phone]  To establish primary care, call above  [facility] EMERGENCY DEPARTMENT [address] [id number] Wilhemina Bonito [place] [address] [phone]  If symptoms worsen   Pt seen in conjunction with Dr. Dione Booze, MD  Ames Dura, DO [facility] Emergency Medicine Resident - PGY-2    Coding    Ames Dura, MD 05/17/14 4401  Dione Booze, MD 05/17/14 [phone]

## 2014-05-17 NOTE — ED Provider Notes (Deleted)
29 year old female comes in with several-day history of sharp chest pain which is worse with movement and worse with deep breath. No fever or chills and no cough. On exam, she has marked chest wall tenderness consistent with costochondritis. Workup in the ED is unremarkable. She is discharged with prescriptions for NSAIDs.  I saw and evaluated the patient, reviewed the resident's note and I agree with the findings and plan.   EKG Interpretation   Date/Time:  Thursday May 17 2014 17:43:41 EST Ventricular Rate:  95 PR Interval:  126 QRS Duration: 84 QT Interval:  342 QTC Calculation: 429 R Axis:   43 Text Interpretation:  Normal sinus rhythm Normal ECG When compared with  ECG of 06/19/2013, No significant change was found Confirmed by [facility]  MD,  Shelley Cocke ([id number]) on 05/17/2014 9:40:24 PM        Dione Boozeavid Kevin Space, MD 05/17/14 [phone]

## 2014-05-17 NOTE — ED Notes (Signed)
Upon discharge, pt requesting different prescription for pain, sts "those ibuprofen don't work and I'm not paying for something that don't work; Cant's he give me something else?" MD aware, no new prescriptions written. Patient aware.

## 2014-05-17 NOTE — Discharge Instructions (Signed)

## 2016-11-28 IMAGING — US US PELVIS COMPLETE
1 series · 14 of 25 positions shown · non-contrast
Comparison: None

CLINICAL DATA: Pelvic pain, spotting

EXAM:
TRANSABDOMINAL AND TRANSVAGINAL ULTRASOUND OF PELVIS
TECHNIQUE: Both transabdominal and transvaginal ultrasound examinations of the
pelvis were performed. Transabdominal technique was performed for
global imaging of the pelvis including uterus, ovaries, adnexal
regions, and pelvic cul-de-sac. It was necessary to proceed with
endovaginal exam following the transabdominal exam to visualize the
endometrium and ovaries.

[Series 1: us pelvis complete · 0.22mm/px · 54 acquisitions, 14 frames shown]
[im 1/54]
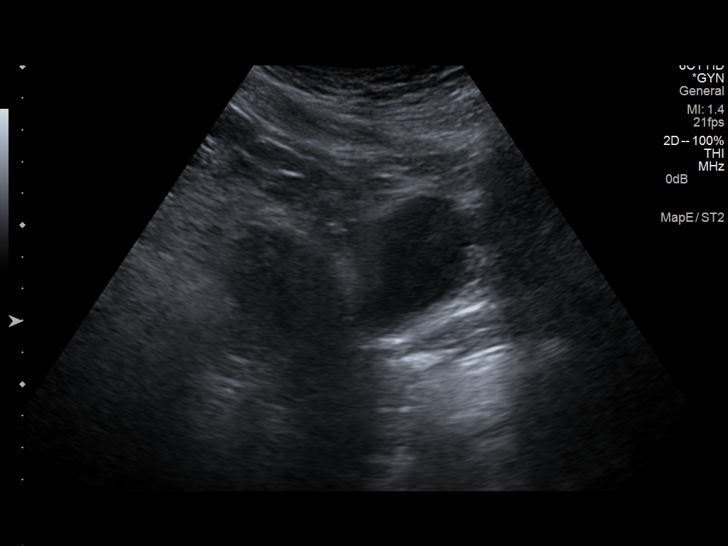
[im 5/54]
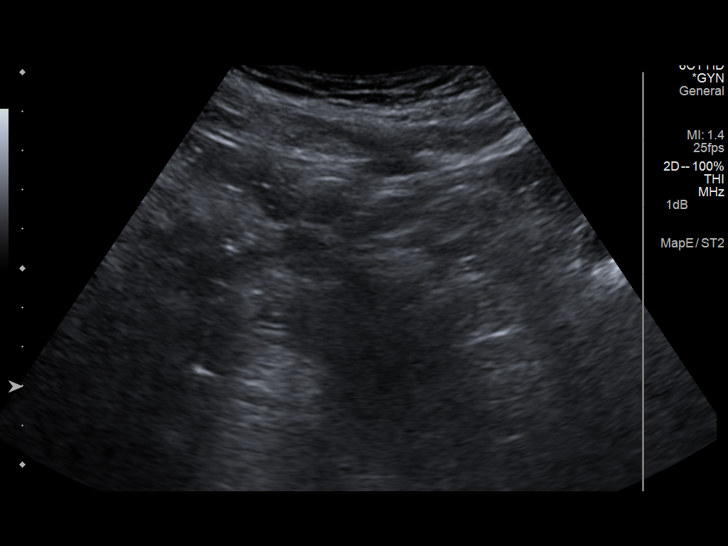
[im 9/54]
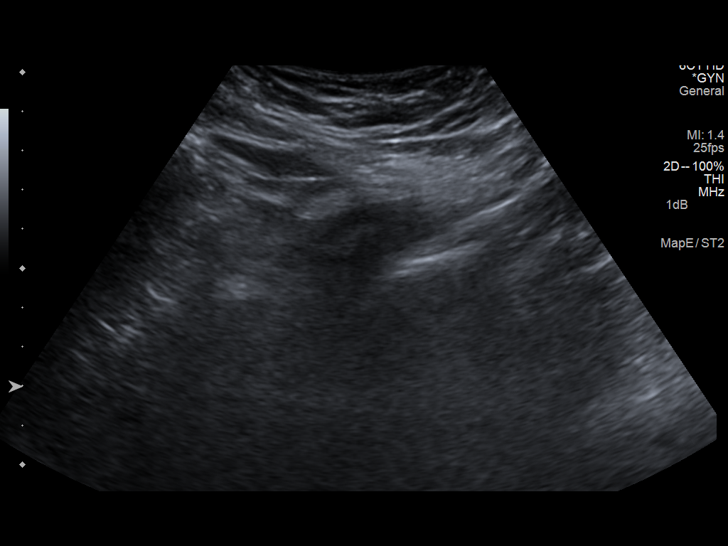
[im 14/54]
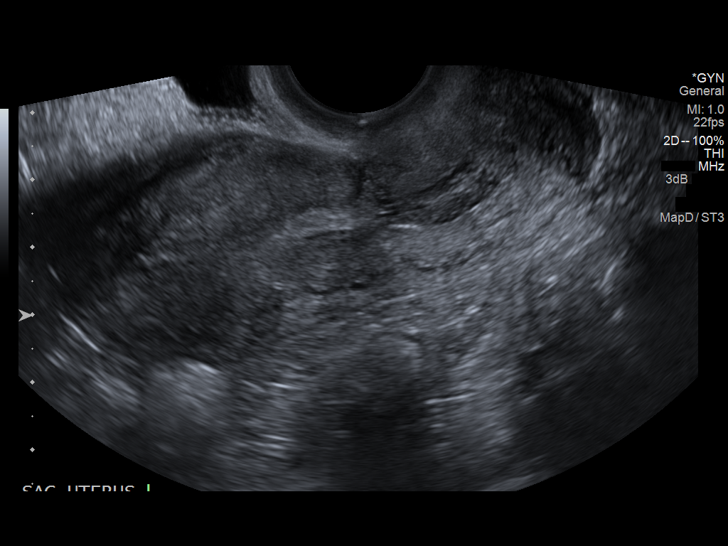
[im 18/54]
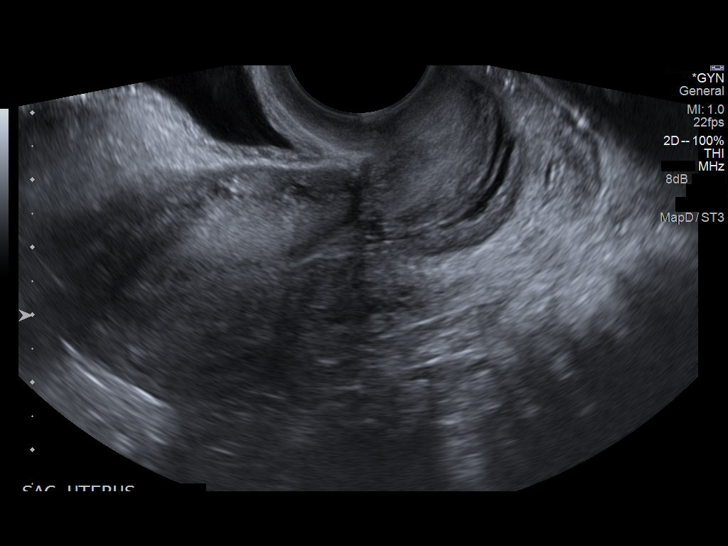
[im 20/54]
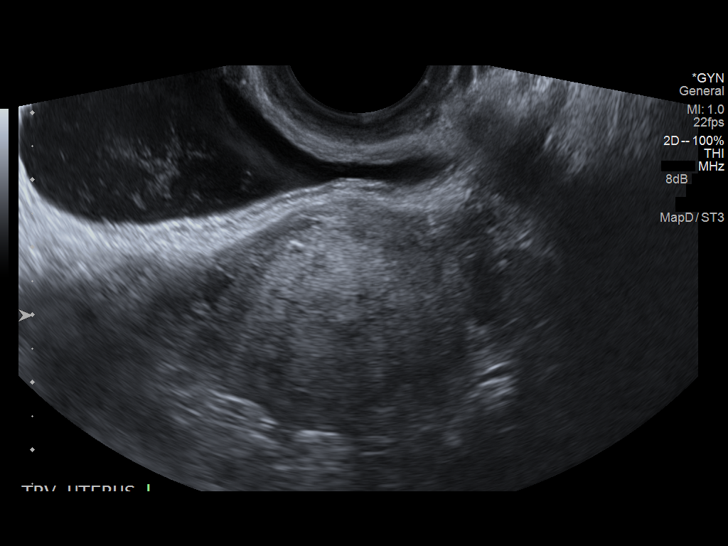
[im 25/54]
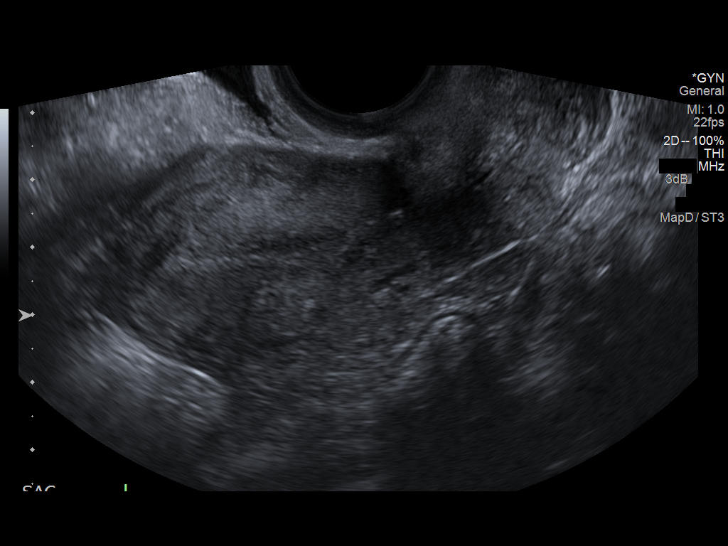
[im 29/54]
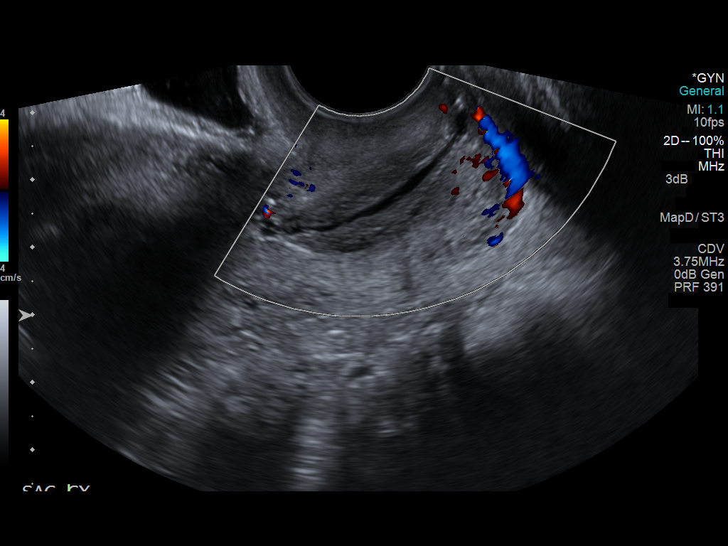
[im 34/54]
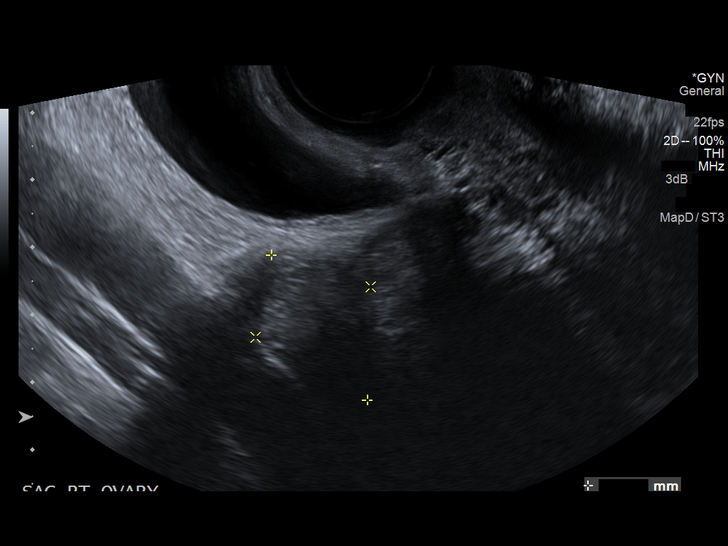
[im 36/54]
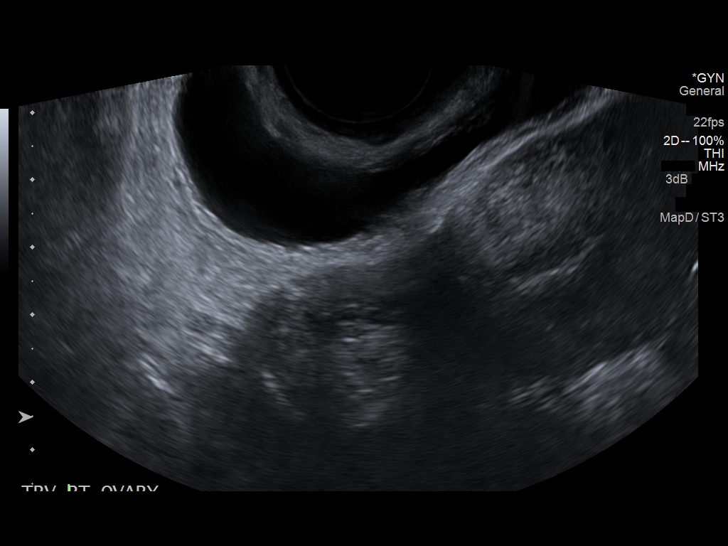
[im 40/54]
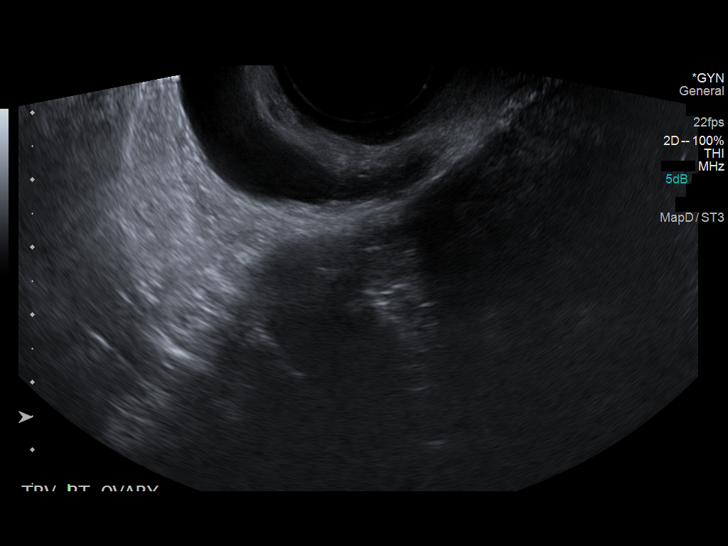
[im 45/54]
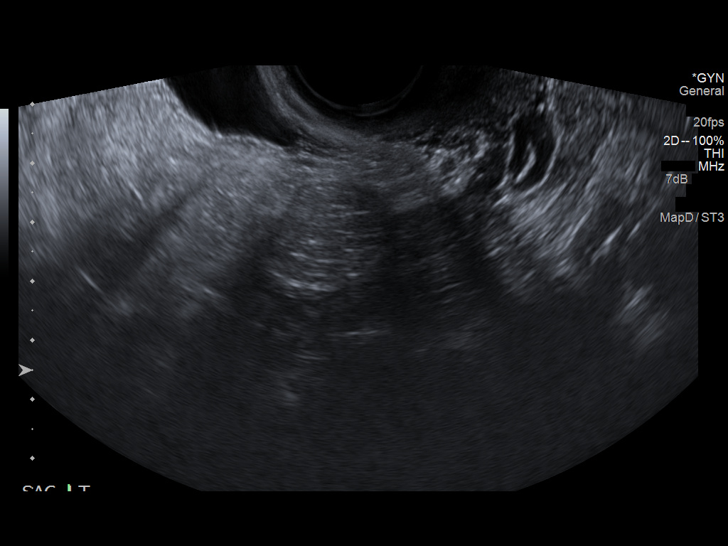
[im 49/54]
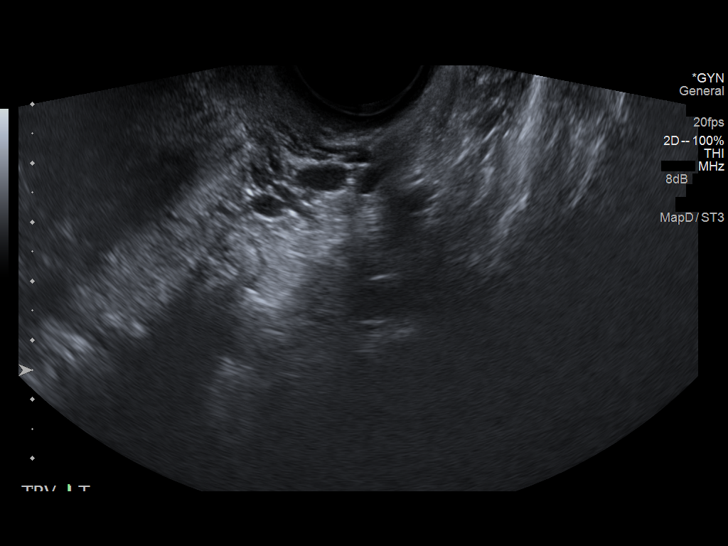
[im 54/54]
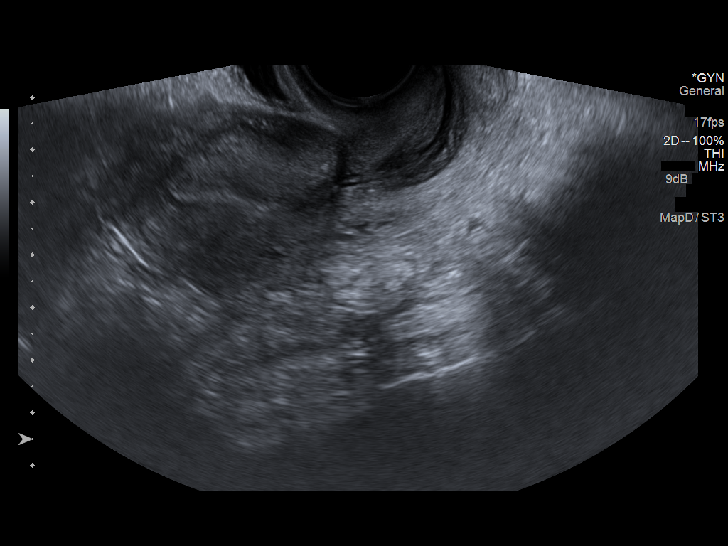

[14 of 25 positions shown; findings below may reference images not displayed]

FINDINGS: Uterus

Measurements: 8.2 x 4.5 x 3.3 cm, anteverted, anteflexed. No
fibroids or other mass visualized.

Endometrium

Thickness: 3 mm, uniformly thin.  No focal abnormality visualized.

Right ovary

Measurements: 2.3 x 1.9 x 1.6 cm. Normal appearance/no adnexal mass.

Left ovary

Measurements: Not visualized. No adnexal mass.

Other findings

No free fluid.
IMPRESSION: Normal exam.  Left ovary not visualized, no left adnexal mass.

## 2017-01-11 IMAGING — CR DG CHEST 2V
2 series · 2 of 2 positions shown · non-contrast
Comparison: None.

CLINICAL DATA: Left-sided chest pain and shortness of breath for 3
days.

EXAM:
CHEST  2 VIEW

[chest pa]
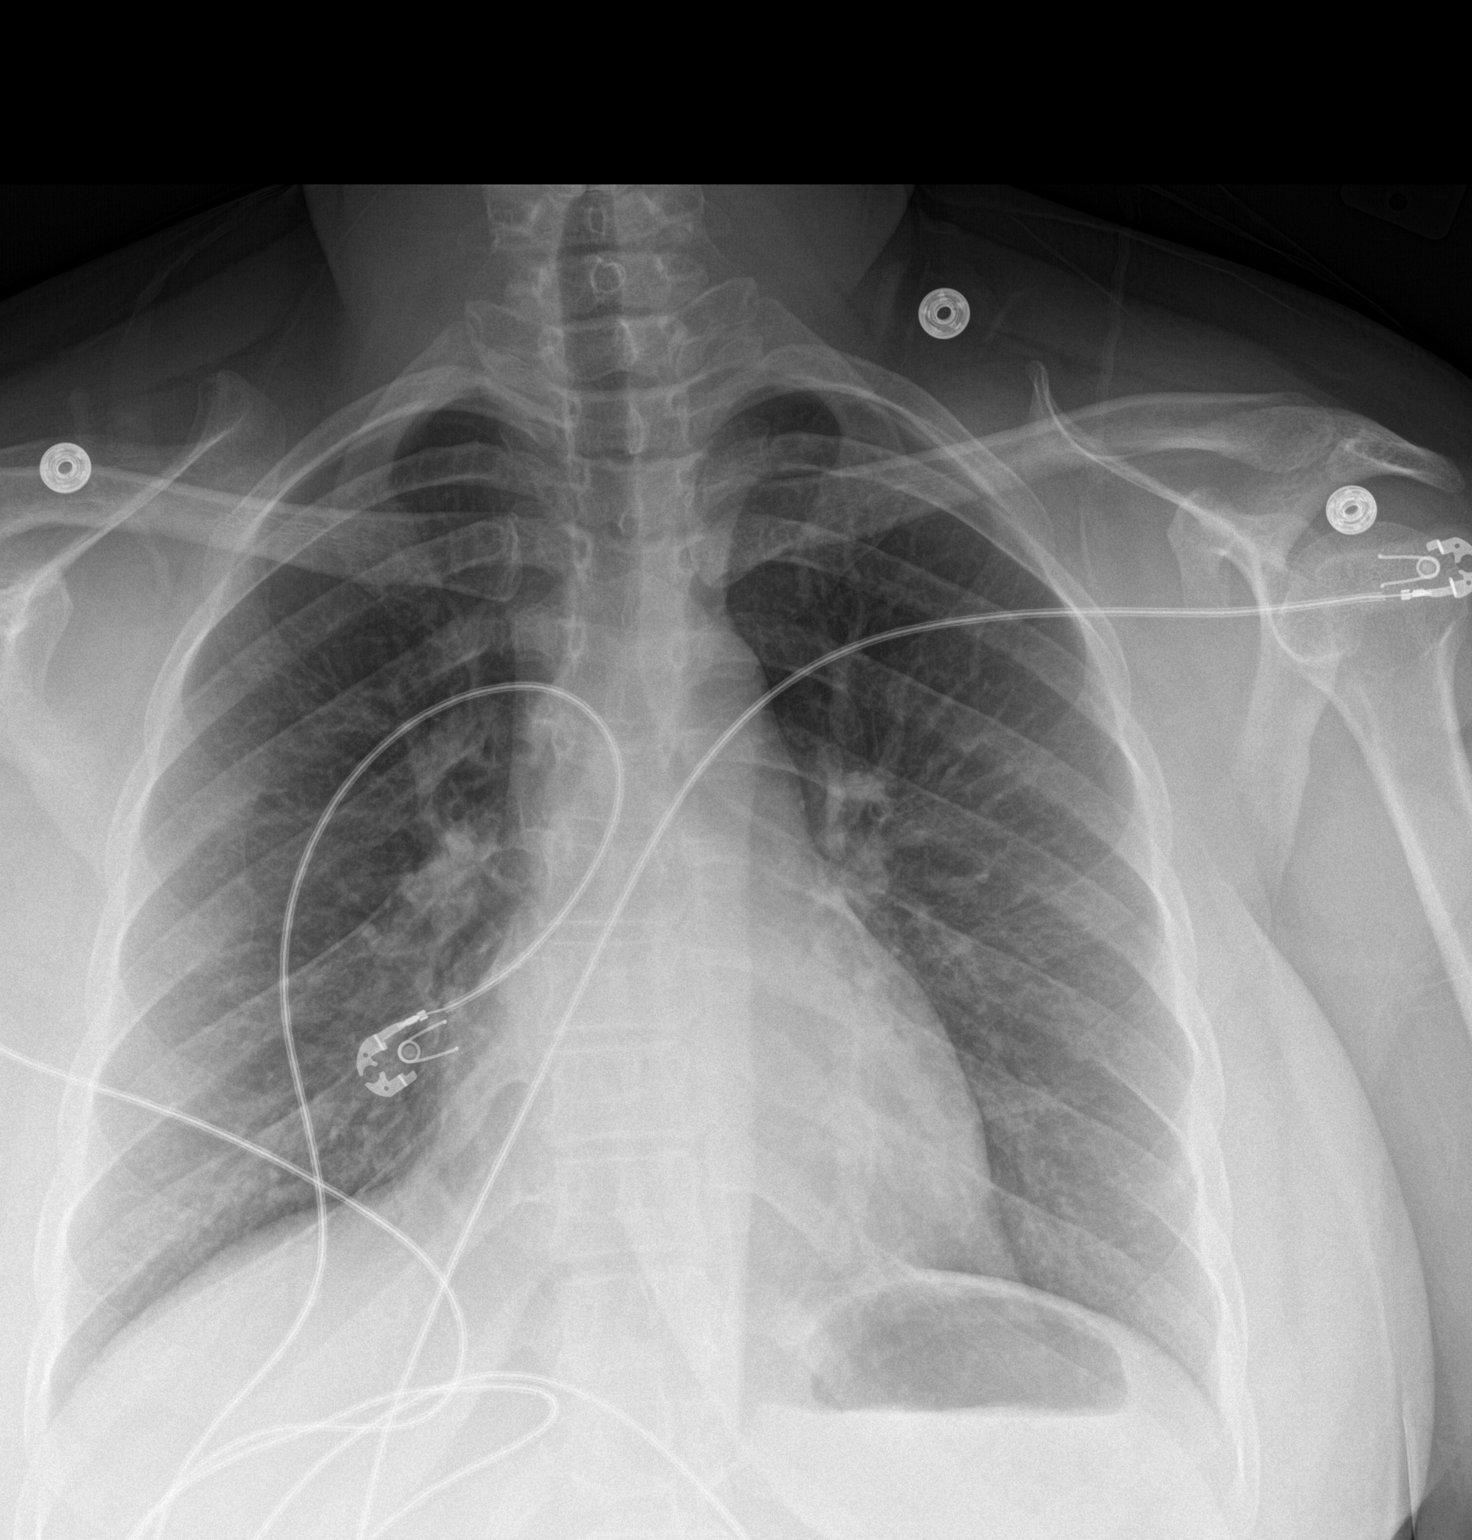

[chest lat]
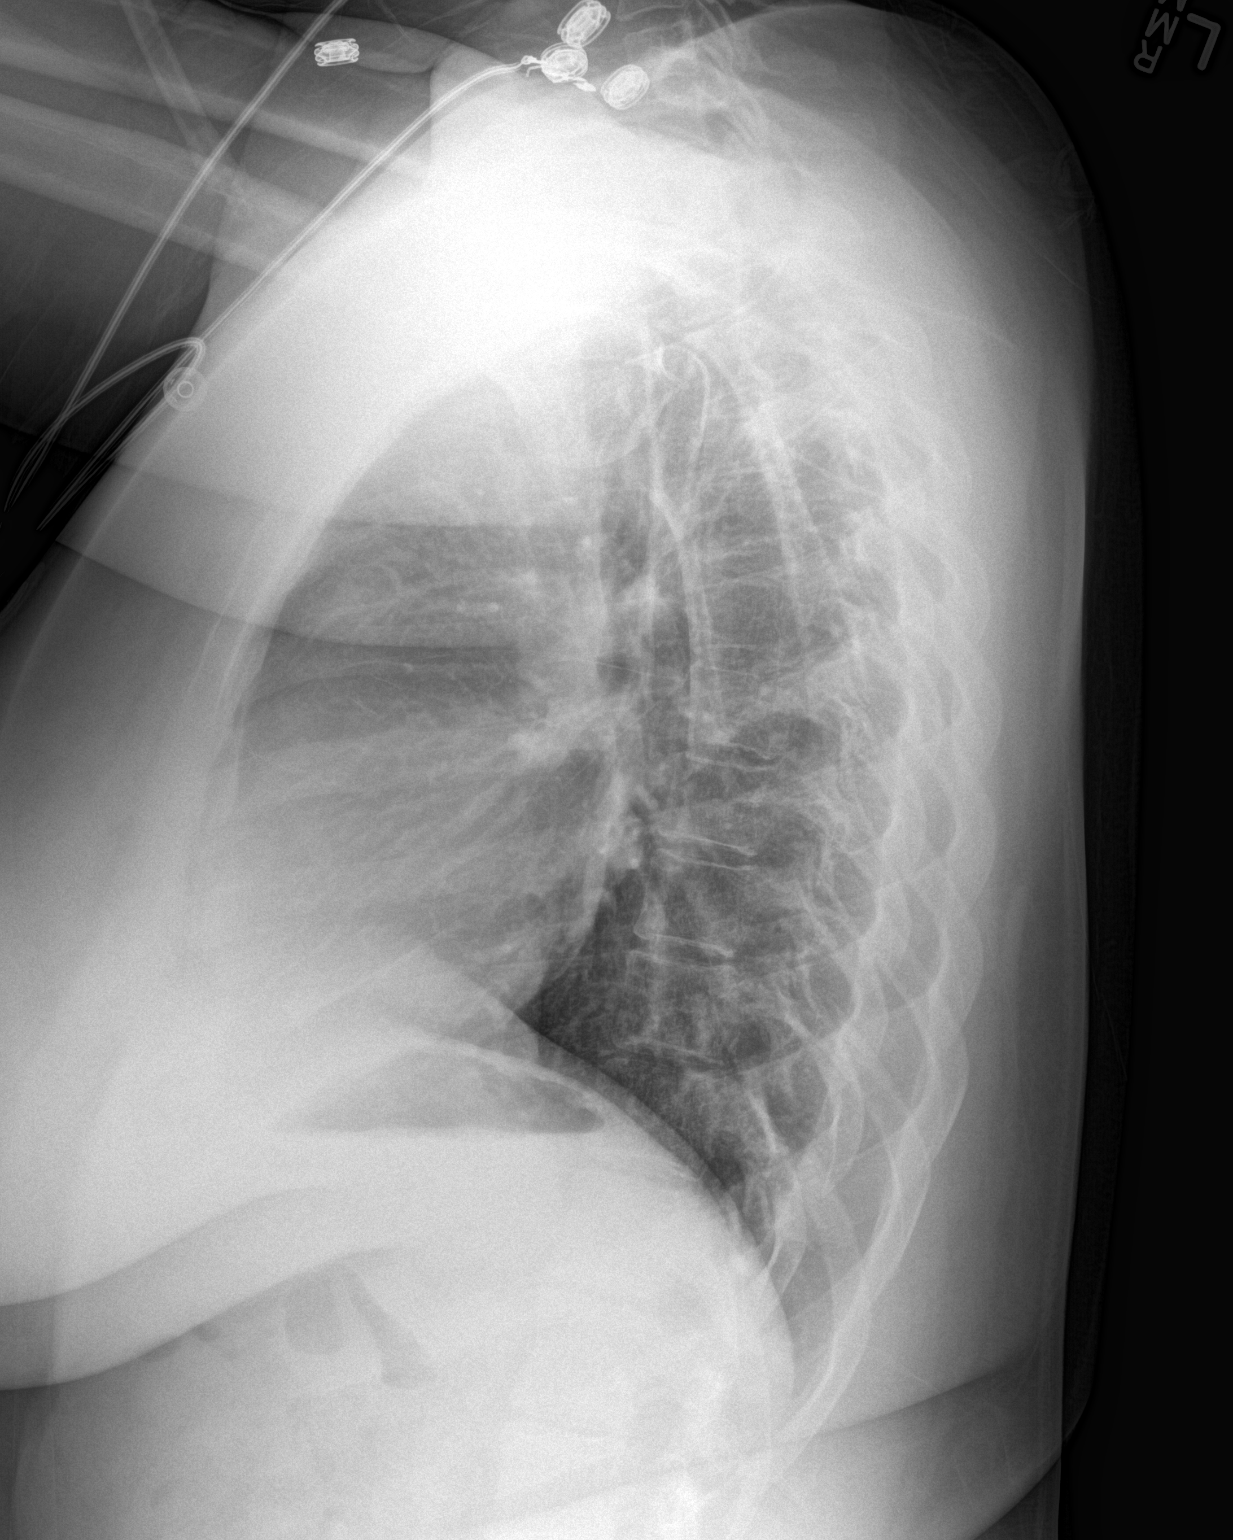

[2 of 2 positions shown; findings below may reference images not displayed]

FINDINGS: Heart size and mediastinal contours are within normal limits. Both
lungs are clear. Visualized skeletal structures are unremarkable.
IMPRESSION: Normal exam.

## 2018-05-26 LAB — HM PAP SMEAR: HM Pap smear: POSITIVE

## 2019-04-26 LAB — HM PAP SMEAR: HM Pap smear: POSITIVE

## 2021-08-07 ENCOUNTER — Ambulatory Visit (LOCAL_COMMUNITY_HEALTH_CENTER): Payer: Medicaid Other | Admitting: Nurse Practitioner

## 2021-08-07 ENCOUNTER — Encounter: Payer: Self-pay | Admitting: Nurse Practitioner

## 2021-08-07 VITALS — BP 96/67 | Ht 62.0 in | Wt 178.6 lb

## 2021-08-07 DIAGNOSIS — Z01419 Encounter for gynecological examination (general) (routine) without abnormal findings: Secondary | ICD-10-CM

## 2021-08-07 DIAGNOSIS — Z3042 Encounter for surveillance of injectable contraceptive: Secondary | ICD-10-CM | POA: Diagnosis not present

## 2021-08-07 DIAGNOSIS — Z3009 Encounter for other general counseling and advice on contraception: Secondary | ICD-10-CM | POA: Diagnosis not present

## 2021-08-07 DIAGNOSIS — Z113 Encounter for screening for infections with a predominantly sexual mode of transmission: Secondary | ICD-10-CM

## 2021-08-07 LAB — HM HEPATITIS C SCREENING LAB: HM Hepatitis Screen: NEGATIVE

## 2021-08-07 LAB — WET PREP FOR TRICH, YEAST, CLUE
Trichomonas Exam: NEGATIVE
Yeast Exam: NEGATIVE

## 2021-08-07 LAB — HM HIV SCREENING LAB: HM HIV Screening: NEGATIVE

## 2021-08-07 LAB — HEPATITIS B SURFACE ANTIGEN

## 2021-08-07 MED ORDER — MEDROXYPROGESTERONE ACETATE 150 MG/ML IM SUSP
150.0000 mg | INTRAMUSCULAR | Status: AC
  Administered 2021-08-07 – 2022-01-23 (×3): 150 mg via INTRAMUSCULAR

## 2021-08-07 NOTE — Progress Notes (Signed)
[facility] ?[facility] ?[address] ?Main Number: [phone] ? ? ? ?Family Planning Visit- Initial Visit ? ?Subjective:  ?Lauren Ferguson is a 36 y.o.  G1P0   being seen today for an initial annual visit and to discuss reproductive life planning.  The patient is currently using Hormonal Injection for pregnancy prevention. Patient reports   does not want a pregnancy in the next year.   ? ? report they are looking for a method that provides High efficacy at preventing pregnancy ? ?Patient has the following medical conditions does not have a problem list on file. ? ?Chief Complaint  ?Patient presents with  ? Contraception  ?  PE, Depo and STD check  ? ? ?Patient reports to clinic today for a physical, birth control, and STD screening.  ? ?Patient denies signs and symptoms   ? ?Body mass index is 32.67 kg/m?. - Patient is eligible for diabetes screening based on BMI and age 50>40?  not applicable ?HA1C ordered? not applicable ? ?Patient reports 1  partner/s in last year. Desires STI screening?  Yes ? ?Has patient been screened once for HCV in the past?  Yes ? No results found for: HCVAB ? ?Does the patient have current drug use (including MJ), have a partner with drug use, and/or has been incarcerated since last result? Yes  ?If yes-- [facility] ?  ?Does the patient meet criteria for HBV testing? Yes ? ?Criteria:  ?-Household, sexual or needle sharing contact with HBV ?-History of drug use ?-HIV positive ?-Those with known Hep C ? ? ?Health Maintenance Due  ?Topic Date Due  ? COVID-19 Vaccine (1) Never done  ? Hepatitis C Screening  Never done  ? TETANUS/TDAP  Never done  ? PAP SMEAR-Modifier  Never done  ? ? ?Review of Systems  ?Constitutional:  Negative for chills, fever, malaise/fatigue and weight loss.  ?HENT:  Negative for congestion, hearing loss and sore throat.   ?Eyes:  Negative for blurred vision, double vision and photophobia.   ?Respiratory:  Negative for shortness of breath.   ?Cardiovascular:  Negative for chest pain.  ?Gastrointestinal:  Negative for abdominal pain, blood in stool, constipation, diarrhea, heartburn, nausea and vomiting.  ?Genitourinary:  Negative for dysuria and frequency.  ?Musculoskeletal:  Negative for back pain, joint pain and neck pain.  ?Skin:  Negative for itching and rash.  ?Neurological:  Negative for dizziness, weakness and headaches.  ?Endo/Heme/Allergies:  Does not bruise/bleed easily.  ?Psychiatric/Behavioral:  Negative for depression, substance abuse and suicidal ideas.   ? ?The following portions of the patient's history were reviewed and updated as appropriate: allergies, current medications, past family history, past medical history, past social history, past surgical history and problem list. Problem list updated. ? ? ?See flowsheet for other program required questions. ? ?Objective:  ? ?Vitals:  ? 08/07/21 1439  ?BP: 96/67  ?Weight: 178 lb 9.6 oz (81 kg)  ?Height: 5\' 2"  (1.575 m)  ? ? ?Physical Exam ?Constitutional:   ?   Appearance: Normal appearance.  ?HENT:  ?   Head: Normocephalic.  ?   Right Ear: External ear normal.  ?   Left Ear: External ear normal.  ?   Nose: Nose normal.  ?   Mouth/Throat:  ?   Lips: Pink.  ?   Mouth: Mucous membranes are moist.  ?   Comments: No visible signs of dental caries  ?Cardiovascular:  ?   Rate and  Rhythm: Normal rate and regular rhythm.  ?Pulmonary:  ?   Effort: Pulmonary effort is normal.  ?   Breath sounds: Normal breath sounds.  ?Chest:  ?   Comments: Breasts:  ?      Right: Normal. No swelling, mass, nipple discharge, skin change or tenderness.  ?      Left: Normal. No swelling, mass, nipple discharge, skin change or tenderness.   ?Abdominal:  ?   General: Abdomen is flat. Bowel sounds are normal.  ?   Palpations: Abdomen is soft.  ?Genitourinary: ?   Comments: External genitalia/pubic area without nits, lice, edema, erythema, lesions and inguinal  adenopathy. ?Vagina with normal mucosa and discharge. ?Cervix without visible lesions. ?Uterus firm, mobile, nt, no masses, no CMT, no adnexal tenderness or fullness. pH 4.5. ?Musculoskeletal:  ?   Cervical back: Full passive range of motion without pain, normal range of motion and neck supple.  ?Skin: ?   General: Skin is warm and dry.  ?Neurological:  ?   Mental Status: She is alert and oriented to person, place, and time.  ?Psychiatric:     ?   Attention and Perception: Attention normal.     ?   Mood and Affect: Mood normal.     ?   Speech: Speech normal.     ?   Behavior: Behavior is cooperative.  ? ? ? ? ?Assessment and Plan:  ?Lauren Ferguson is a 36 y.o. female presenting to the [facility] Department for an initial annual wellness/contraceptive visit ? ?Contraception counseling: Reviewed options based on patient desire and reproductive life plan. Patient is interested in Hormonal Injection. This was provided to the patient today.  ? ?Risks, benefits, and typical effectiveness rates were reviewed.  Questions were answered.  Written information was also given to the patient to review.   ? ?The patient will follow up in  1 years for surveillance.  The patient was told to call with any further questions, or with any concerns about this method of contraception.  Emphasized use of condoms 100% of the time for STI prevention. ? ?Need for ECP was assessed. No ECP given due to continuous use of contraception.  ? ?1. Family planning counseling ?-36 year old female in clinic today for a physical, STD screening, and birth control. ?-ROS reviewed, no complaints. ? ? ?2. Well woman exam with routine gynecological exam ?-Normal well woman exam.   ?-CBE today, next due 07/2024 ?-PAP performed today. ?- IGP, Aptima HPV ? ?3. Screening examination for venereal disease ?-STD screening today. ?-Patient accepted all screenings including vaginal CT/GC and bloodwork for HIV/RPR.  ?Patient meets criteria for HepB screening?  Yes. Ordered? Yes ?Patient meets criteria for HepC screening? Yes. Ordered? Yes ? ?Treat wet prep per standing order ?Discussed time line for State Lab results and that patient will be called with positive results and encouraged patient to call if she had not heard in 2 weeks.  ?Counseled to return or seek care for continued or worsening symptoms ?Recommended condom use with all sex ? ?Patient is currently using Hormonal Contraception: Injection, Rings and Patches to prevent pregnancy.  ? ?- WET PREP FOR TRICH, YEAST, CLUE ?- Chlamydia/Gonorrhea [place] [facility] ?- HIV/HCV [facility] ?- Syphilis Serology,  Lab ?- HBV Antigen/Antibody State [facility] ? ?4. Surveillance for Depo-Provera contraception ?-Patient desires to continue with Depo.  May have Depo 150 MG q11-13 wks x 1 year.  ?- medroxyPROGESTERone (DEPO-PROVERA) injection 150 mg ? ? ? ? ?  Return in about 11 weeks (around 10/23/2021) for Routine DMPA injection. ? ? ? ?Glenna Fellows, FNP ?

## 2021-08-07 NOTE — Progress Notes (Signed)
Pt here for PE, Pap, Depo and STD check.  Wet mount results reviewed, no treatment required.  Depo 150 mg given IM in Rt Deltoid.  Pt given reminder card to return in 11-13 weeks for next Depo injection.  Berdie Ogren, RN ? ?

## 2021-08-14 LAB — IGP, APTIMA HPV
HPV Aptima: POSITIVE — AB
PAP Smear Comment: 0

## 2021-08-27 ENCOUNTER — Telehealth: Payer: Self-pay

## 2021-08-27 NOTE — Telephone Encounter (Signed)
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  Repeat in 1 year (07-2022) per Glenna Fellows, NP.  Patient reports having a + [facility] a couple of years ago and wanted [place] to know. She did sign an ROI while she was in the office. I told her I would request her PAP's again. Hart Carwin, RN ? ?Message sent to chart room Boneta Lucks, Inetta Fermo and Jacquelyne Balint) to ask them to request PAP smears x 2 (one of which should be abnormal if any) via staff message in epic with patient linked. Hart Carwin, RN ? ?

## 2021-10-02 ENCOUNTER — Encounter: Payer: Self-pay | Admitting: Nurse Practitioner

## 2021-10-27 ENCOUNTER — Ambulatory Visit (LOCAL_COMMUNITY_HEALTH_CENTER): Payer: BLUE CROSS/BLUE SHIELD

## 2021-10-27 VITALS — BP 108/74 | Ht 62.0 in | Wt 179.5 lb

## 2021-10-27 DIAGNOSIS — Z3042 Encounter for surveillance of injectable contraceptive: Secondary | ICD-10-CM

## 2021-10-27 DIAGNOSIS — Z3009 Encounter for other general counseling and advice on contraception: Secondary | ICD-10-CM

## 2021-10-27 NOTE — Progress Notes (Signed)
11 weeks 4 days post Depo.  Voices no concerns. Medroxyprogesterone 150mg  per order by , FNP dated 08/07/2021 given in lt. Deltoid,  Tolerated well. Next depo due 01/12/2022.  Reminder card provided.

## 2021-12-30 ENCOUNTER — Ambulatory Visit
Admission: EM | Admit: 2021-12-30 | Discharge: 2021-12-30 | Disposition: A | Discharge status: Home / Self Care | Payer: BLUE CROSS/BLUE SHIELD | Attending: Emergency Medicine | Admitting: Emergency Medicine

## 2021-12-30 ENCOUNTER — Encounter: Payer: Self-pay | Admitting: Emergency Medicine

## 2021-12-30 DIAGNOSIS — R600 Localized edema: Secondary | ICD-10-CM

## 2021-12-30 MED ORDER — HYDROCHLOROTHIAZIDE 12.5 MG PO CAPS: 12.5000 mg | ORAL_CAPSULE | Freq: Every day | ORAL | 0 refills | Status: AC

## 2021-12-30 NOTE — ED Triage Notes (Signed)
Pt c/o bilateral foot pain, and swelling. Started about 2 weeks ago. She states the pain radiates up her legs. She has been taking tylenol for the pain.

## 2021-12-30 NOTE — Discharge Instructions (Signed)
Take the HCTZ every morning. This will help lower your blood pressure and also help wit the swelling in your legs and feet.  You still need to drink water while you are taking this medication so as to not become dehydrated.  Buy and wear full length compression hose to help promote venous return of fluid so that it does not stay in yoru legs and cause pain.  Follow-up with your PCP for continued or worsening symptoms.

## 2021-12-30 NOTE — ED Provider Notes (Signed)
[facility]    CSN: [id number] Arrival date & time: 12/30/21  1502      History   Chief Complaint Chief Complaint  Patient presents with   Foot Pain    bilateral    HPI Lauren Ferguson is a 36 y.o. female.   HPI  36 year old female here for evaluation of bilateral feet pain and swelling.  Patient reports that for last 2 weeks she has been experiencing pain and swelling in both of her feet.  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  At the end of the day her feet hurt and her legs swell.  The pain and swelling extend up into her calves.  She states that her left is worse than her right.  The swelling and pain do improve some when she lays down and elevates her feet.  She denies any injuries.  She is not taking any medications or supplements.  She is a smoker.  She drinks occasionally.  Past Medical History:  Diagnosis Date   Asthma    Sleep apnea     There are no problems to display for this patient.   Past Surgical History:  Procedure Laterality Date   CESAREAN SECTION      OB History     Gravida  1   Para      Term      Preterm      AB      Living  1      SAB      IAB      Ectopic      Multiple      Live Births               Home Medications    Prior to Admission medications   Medication Sig Start Date End Date Taking? Authorizing Provider  albuterol (PROVENTIL HFA;VENTOLIN HFA) 108 (90 BASE) MCG/ACT inhaler Inhale into the lungs every 6 (six) hours as needed for wheezing or shortness of breath.   Yes [provider]  hydrochlorothiazide (MICROZIDE) 12.5 MG capsule Take 1 capsule (12.5 mg total) by mouth daily. 12/30/21  Yes Margarette Canada, NP  medroxyPROGESTERone (DEPO-PROVERA) 150 MG/ML injection Inject 150 mg into the muscle every 3 (three) months.  02/22/14  Yes [provider]    Family History Family History  Problem Relation Age of Onset   Asthma  Mother    Hypertension Mother    Cancer Father    Gout Father    Cancer Paternal Grandmother    Heart failure Paternal Aunt    Hypertension Paternal Aunt    Diabetes Paternal Aunt    Cancer Paternal Aunt     Social History Social History   Tobacco Use   Smoking status: Every Day    Packs/day: 0.50    Types: Cigarettes   Smokeless tobacco: Never  Vaping Use   Vaping Use: Some days  Substance Use Topics   Alcohol use: Yes    Comment: Socially   Drug use: Yes    Types: Marijuana    Comment: 2//week     Allergies   Other and Rocephin [ceftriaxone]   Review of Systems Review of Systems  Respiratory:  Negative for shortness of breath.   Cardiovascular:  Positive for leg swelling. Negative for chest pain.  Hematological: Negative.      Physical Exam Triage Vital Signs ED Triage Vitals  Enc Vitals  Group     BP 12/30/21 1518 (!) 147/83     Pulse Rate 12/30/21 1518 82     Resp 12/30/21 1518 16     Temp 12/30/21 1518 98.6 F (37 C)     Temp Source 12/30/21 1518 Oral     SpO2 12/30/21 1518 98 %     Weight 12/30/21 1516 179 lb 7.3 oz (81.4 kg)     Height 12/30/21 1516 5\' 2"  (1.575 m)     Head Circumference --      Peak Flow --      Pain Score 12/30/21 1515 7     Pain Loc --      Pain Edu? --      Excl. in [place]? --    No data found.  Updated Vital Signs BP (!) 147/83 (BP Location: Right Arm)   Pulse 82   Temp 98.6 F (37 C) (Oral)   Resp 16   Ht 5\' 2"  (1.575 m)   Wt 179 lb 7.3 oz (81.4 kg)   SpO2 98%   BMI 32.82 kg/m   Visual Acuity Right Eye Distance:   Left Eye Distance:   Bilateral Distance:    Right Eye Near:   Left Eye Near:    Bilateral Near:     Physical Exam Vitals and nursing note reviewed.  Constitutional:      Appearance: Normal appearance. She is not ill-appearing.  HENT:     Head: Normocephalic and atraumatic.  Cardiovascular:     Rate and Rhythm: Normal rate and regular rhythm.     Pulses: Normal pulses.     Heart sounds:  Normal heart sounds. No murmur heard.    No friction rub. No gallop.  Pulmonary:     Effort: Pulmonary effort is normal.     Breath sounds: Normal breath sounds. No wheezing, rhonchi or rales.  Musculoskeletal:     Right lower leg: No edema.     Left lower leg: No edema.  Skin:    General: Skin is warm and dry.     Capillary Refill: Capillary refill takes less than 2 seconds.     Findings: No erythema or rash.  Neurological:     General: No focal deficit present.     Mental Status: She is alert and oriented to person, place, and time.  Psychiatric:        Mood and Affect: Mood normal.        Behavior: Behavior normal.        Thought Content: Thought content normal.        Judgment: Judgment normal.      UC Treatments / Results  Labs (all labs ordered are listed, but only abnormal results are displayed) Labs Reviewed - No data to display  EKG   Radiology No results found.  Procedures Procedures (including critical care time)  Medications Ordered in UC Medications - No data to display  Initial Impression / Assessment and Plan / UC Course  I have reviewed the triage vital signs and the nursing notes.  Pertinent labs & imaging results that were available during my care of the patient were reviewed by me and considered in my medical decision making (see chart for details).   Patient is a nontoxic-appearing 36 year old female here for evaluation of pain and swelling in both of her feet that radiates up to her lower legs and has been present for the past 2 weeks.  She denies any chest pain or shortness of breath.  The swelling and pain do improve when she elevates her feet but they do not completely go away per her report.  She does spend a lot of time on her feet at her job.  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  On exam patient is a benign cardiopulmonary exam with S1-S2 heart sounds with regular rate and rhythm and lung sounds that are clear to auscultation all  fields.  Patient does not have any swelling at present in her feet or lower legs.  No calf tenderness.  She has full sensation range of motion of her feet and ankles.  DP PT pulses are 2+.  I suspect that patient is experiencing dependent pedal edema as result of body habitus and age.  She is also a smoker.  I have advised her that she needs to get compression hose and wear them to help provide pumping action to lift the fluid from her extremities back to her central circulation so she can get rid of it.  She also needs to split her time between sitting and standing as doing too much of either for long periods impedes venous return.  She does have a mildly elevated blood pressure here.  I will do a trial of 12.5 mg hydrochlorothiazide daily to help her blood pressure as well and has helped her peripheral edema.  Patient does not currently have a PCP as she just moved from [place] and I have encouraged her to establish care with a primary care provider to manage these issues going forward.  Return precautions reviewed.   Final Clinical Impressions(s) / UC Diagnoses   Final diagnoses:  Pedal edema     Discharge Instructions      Take the HCTZ every morning. This will help lower your blood pressure and also help wit the swelling in your legs and feet.  You still need to drink water while you are taking this medication so as to not become dehydrated.  Buy and wear full length compression hose to help promote venous return of fluid so that it does not stay in yoru legs and cause pain.  Follow-up with your PCP for continued or worsening symptoms.      ED Prescriptions     Medication Sig Dispense Auth. Provider   hydrochlorothiazide (MICROZIDE) 12.5 MG capsule Take 1 capsule (12.5 mg total) by mouth daily. 30 capsule Becky Augusta, NP      PDMP not reviewed this encounter.   Becky Augusta, NP 12/30/21 1559

## 2022-01-01 ENCOUNTER — Emergency Department
Admission: EM | Admit: 2022-01-01 | Discharge: 2022-01-02 | Disposition: A | Discharge status: Home / Self Care | Payer: BLUE CROSS/BLUE SHIELD | Attending: Emergency Medicine | Admitting: Emergency Medicine

## 2022-01-01 ENCOUNTER — Encounter: Payer: Self-pay | Admitting: Emergency Medicine

## 2022-01-01 DIAGNOSIS — R609 Edema, unspecified: Secondary | ICD-10-CM

## 2022-01-01 DIAGNOSIS — R600 Localized edema: Secondary | ICD-10-CM | POA: Insufficient documentation

## 2022-01-01 DIAGNOSIS — M7989 Other specified soft tissue disorders: Secondary | ICD-10-CM | POA: Diagnosis present

## 2022-01-01 NOTE — ED Triage Notes (Signed)
Pt presents via POV with complaints of bilateral foot swelling causing pain that started 2 weeks ago. Seen at [facility] who prescribed her a diuretic which she has taken 2 doses of and she notes the swelling in her feet hasn't improved. Pt ambulatory without assistance. Denies fevers, chills, CP or SOB.

## 2022-01-02 NOTE — Discharge Instructions (Signed)
Please take your medication as prescribed, use compression stockings as we discussed, read through the included information, take ibuprofen and/or Tylenol as needed according to label instructions, and follow-up with the regular doctor.  We recommend that you call [facility] clinic and explained that you need a follow-up appointment from an emergency department visit as well as to establish a primary care doctor.  They should be able to help you out.

## 2022-01-02 NOTE — ED Provider Notes (Signed)
[facility] Provider Note    Event Date/Time   First MD Initiated Contact with Patient 01/01/22 2352     (approximate)   History   Foot Pain   HPI   Danyal Adorno is a 36 y.o. female who presents for evaluation of intermittent but persistent swelling of both of her feet.  She said that this has been going on for a few weeks.  She went to an urgent care 2 to 3 days ago and was prescribed hydrochlorothiazide for blood pressure as well as to help as a diuretic.  She does not have a primary care doctor.  She has taken 2 doses of the medicine and came in tonight to the emergency department because she said it is no better.  However she did admit that her swelling is gone right now, but she also expressed some concern that sometimes she has a headache.  She works long hours on her feet but tries to raise her legs when possible.  She followed the recommendations of the provider at the urgent care and tried getting some compression stockings but she is not wearing them currently.  She urinates normally but not excessively.  She has had no recent fever, chest pain, shortness of breath, nausea, nor vomiting.     Physical Exam   Triage Vital Signs: ED Triage Vitals  Enc Vitals Group     BP 01/01/22 2341 121/85     Pulse Rate 01/01/22 2341 81     Resp 01/01/22 2341 18     Temp 01/01/22 2341 98.5 F (36.9 C)     Temp Source 01/01/22 2341 Oral     SpO2 01/01/22 2341 100 %     Weight 01/01/22 2341 79.4 kg (175 lb)     Height 01/01/22 2341 1.575 m (5\' 2" )     Head Circumference --      Peak Flow --      Pain Score 01/01/22 2345 6     Pain Loc --      Pain Edu? --      Excl. in [place]? --     Most recent vital signs: Vitals:   01/01/22 2341  BP: 121/85  Pulse: 81  Resp: 18  Temp: 98.5 F (36.9 C)  SpO2: 100%     General: Awake, no distress.  CV:  Good peripheral perfusion.  No peripheral edema at this time. Resp:  Normal effort.  Abd:  No distention.     ED Results / Procedures / Treatments   Labs (all labs ordered are listed, but only abnormal results are displayed) Labs Reviewed - No data to display   PROCEDURES:  Critical Care performed: No  Procedures   MEDICATIONS ORDERED IN ED: Medications - No data to display   IMPRESSION / MDM / [place] / ED COURSE  I reviewed the triage vital signs and the nursing notes.                              Differential diagnosis includes, but is not limited to, dependent peripheral edema, new onset heart failure, electrolyte or metabolic abnormality.  Patient's presentation is most consistent with acute, uncomplicated illness.  Patient's vitals are stable and within normal limits.  Patient has no peripheral edema at this time.  She has only taken hydrochlorothiazide for 2 days.  I engaged in some patient education about the nature of medications, trying to therapy  for a couple of days and not expecting immediate changes, the importance of primary care follow-up, peripheral edema, etc.  She claims to understand and I provided information about [facility] clinic with whom she can schedule a primary care follow-up.  I encouraged her to continue trying all of the symptomatic treatments previously discussed by urgent care and to follow-up with the next billable opportunity.  I gave my usual and customary return precautions.  There is no indication she is suffering from an acute nor emergent medical condition at this time and does not require additional evaluation.       FINAL CLINICAL IMPRESSION(S) / ED DIAGNOSES   Final diagnoses:  Peripheral edema     Rx / DC Orders   ED Discharge Orders     None        Note:  This document was prepared using Dragon voice recognition software and may include unintentional dictation errors.   Loleta Rose, MD 01/02/22 [id number]

## 2022-01-08 ENCOUNTER — Ambulatory Visit: Payer: Self-pay

## 2022-01-08 NOTE — Telephone Encounter (Signed)
Patient called, left VM to return the call to the office to discuss symptoms with a nurse.  Summary: Fluid build up in legs and feet   The patient called in stating she was seen at the urgent care in [facility] on 9/19 and then went to [facility] on 09/21 for fluid build up. She has pressure socks right now and on fluid pills but she is still hurting and that is barely helping her because she is on her feet a lot. She is not established anywhere and she says appointment are too far out. She is in a lot of pain at night and can barely sleep because of it. Please assist patient further

## 2022-01-08 NOTE — Telephone Encounter (Signed)
  Chief Complaint: Swelling Symptoms: Swelling both legs. Can wear shoes. Swelling worse after standing all day. Went to [facility] ED 9/19/ and 9/21. PAinful 8/10 Frequency: Months Pertinent Negatives: Patient denies SOB. Unsure if red, warm. Disposition: [] ED /[] Urgent Care (no appt availability in office) / [] Appointment(In office/virtual)/ []  [facility]/ [] Home Care/ [] Refused Recommended Disposition /[x] [place] [facility]/ []  Follow-up with PCP Additional Notes: [facility] for acute issue. States cannot go today, information provided for Saturdays location. Will attempt to secure NP appt and call pt back as requested. Care advise provided, ED for worsening symptoms. Pt verbalizes understanding. Reason for Disposition  [1] MILD swelling of both ankles (i.e., pedal edema) AND [2] new-onset or worsening  Answer Assessment - Initial Assessment Questions 1. ONSET: "When did the swelling start?" (e.g., minutes, hours, days)    Over a month ago 2. LOCATION: "What part of the leg is swollen?"  "Are both legs swollen or just one leg?"     Both legs, entire leg, feet 3. SEVERITY: "How bad is the swelling?" (e.g., localized; mild, moderate, severe)   - Localized: Small area of swelling localized to one leg.   - MILD pedal edema: Swelling limited to foot and ankle, pitting edema < 1/4 inch (6 mm) deep, rest and elevation eliminate most or all swelling.   - MODERATE edema: Swelling of lower leg to knee, pitting edema > 1/4 inch (6 mm) deep, rest and elevation only partially reduce swelling.   - SEVERE edema: Swelling extends above knee, facial or hand swelling present.      moderate 4. REDNESS: "Does the swelling look red or infected?"     Unsure 5. PAIN: "Is the swelling painful to touch?" If Yes, ask: "How painful is it?"   (Scale 1-10; mild, moderate or severe)     8/10 6. FEVER: "Do you have a fever?" If Yes, ask: "What is it, how was it measured, and when did it start?"       no 7. CAUSE: "What do you think is causing the leg swelling?"     *No Answer* 8. MEDICAL HISTORY: "Do you have a history of blood clots (e.g., DVT), cancer, heart failure, kidney disease, or liver failure?"     *No Answer* 9. RECURRENT SYMPTOM: "Have you had leg swelling before?" If Yes, ask: "When was the last time?" "What happened that time?"     *No Answer* 10. OTHER SYMPTOMS: "Do you have any other symptoms?" (e.g., chest pain, difficulty breathing)       no  Protocols used: Leg Swelling and Edema-A-AH

## 2022-01-09 NOTE — Telephone Encounter (Signed)
Left pt. Message with New Patient appointment 05/11/22 @ 2:20.Practice phone number given as well.

## 2022-01-23 ENCOUNTER — Ambulatory Visit (LOCAL_COMMUNITY_HEALTH_CENTER): Payer: BLUE CROSS/BLUE SHIELD

## 2022-01-23 VITALS — BP 109/71 | Ht 62.0 in | Wt 186.0 lb

## 2022-01-23 DIAGNOSIS — Z3042 Encounter for surveillance of injectable contraceptive: Secondary | ICD-10-CM | POA: Diagnosis not present

## 2022-01-23 DIAGNOSIS — Z3009 Encounter for other general counseling and advice on contraception: Secondary | ICD-10-CM

## 2022-01-23 NOTE — Progress Notes (Signed)
12 weeks 4 days post depo. Pt explains she moved from [place] recently and does not have PCP and wants to establish care and have her "upset stomach" and nausea evaluated. Has insurance. Local provider list given.   Depo given today per order by Collene Leyden, FNP dated 08/07/2021. Tolerated well R delt. Next depo due 04/10/2022, has reminder. Josie Saunders, RN

## 2022-05-11 ENCOUNTER — Ambulatory Visit: Payer: Self-pay | Admitting: Internal Medicine

## 2022-08-04 NOTE — Progress Notes (Signed)
Patient was called to schedule appointment for repeat pap. Patient states she has moved out of state. Will follow up with PCP there.
